# Patient Record
Sex: Male | Born: 1975 | Race: White | Hispanic: No | Marital: Married | Smoking: Never smoker
Health system: Southern US, Community
[De-identification: ages and names within clinical notes are randomized; demographics above are authoritative.]

## PROBLEM LIST (undated history)

## (undated) DIAGNOSIS — Z8669 Personal history of other diseases of the nervous system and sense organs: Secondary | ICD-10-CM

## (undated) DIAGNOSIS — K219 Gastro-esophageal reflux disease without esophagitis: Secondary | ICD-10-CM

## (undated) DIAGNOSIS — S46319A Strain of muscle, fascia and tendon of triceps, unspecified arm, initial encounter: Secondary | ICD-10-CM

## (undated) DIAGNOSIS — K635 Polyp of colon: Secondary | ICD-10-CM

## (undated) DIAGNOSIS — T7840XA Allergy, unspecified, initial encounter: Secondary | ICD-10-CM

## (undated) DIAGNOSIS — K449 Diaphragmatic hernia without obstruction or gangrene: Secondary | ICD-10-CM

## (undated) DIAGNOSIS — K589 Irritable bowel syndrome without diarrhea: Secondary | ICD-10-CM

## (undated) DIAGNOSIS — K859 Acute pancreatitis without necrosis or infection, unspecified: Secondary | ICD-10-CM

## (undated) HISTORY — DX: Allergy, unspecified, initial encounter: T78.40XA

## (undated) HISTORY — PX: LAPAROSCOPIC CHOLECYSTECTOMY: SUR755

## (undated) HISTORY — DX: Gastro-esophageal reflux disease without esophagitis: K21.9

## (undated) HISTORY — DX: Acute pancreatitis without necrosis or infection, unspecified: K85.90

## (undated) HISTORY — DX: Diaphragmatic hernia without obstruction or gangrene: K44.9

## (undated) HISTORY — DX: Irritable bowel syndrome, unspecified: K58.9

## (undated) HISTORY — PX: POLYPECTOMY: SHX149

## (undated) HISTORY — DX: Polyp of colon: K63.5

## (undated) HISTORY — PX: SPHINCTEROTOMY: SHX5279

## (undated) HISTORY — PX: TONSILLECTOMY: SUR1361

## (undated) HISTORY — DX: Personal history of other diseases of the nervous system and sense organs: Z86.69

## (undated) HISTORY — PX: OTHER SURGICAL HISTORY: SHX169

---

## 2003-12-05 ENCOUNTER — Other Ambulatory Visit: Payer: Self-pay

## 2003-12-06 ENCOUNTER — Encounter: Payer: Self-pay | Admitting: Gastroenterology

## 2003-12-08 ENCOUNTER — Encounter: Payer: Self-pay | Admitting: Gastroenterology

## 2004-01-02 ENCOUNTER — Encounter: Payer: Self-pay | Admitting: Gastroenterology

## 2004-01-05 ENCOUNTER — Encounter: Payer: Self-pay | Admitting: Gastroenterology

## 2004-01-26 ENCOUNTER — Other Ambulatory Visit: Payer: Self-pay

## 2004-04-13 ENCOUNTER — Encounter: Payer: Self-pay | Admitting: Gastroenterology

## 2004-04-16 ENCOUNTER — Ambulatory Visit: Payer: Self-pay | Admitting: Gastroenterology

## 2004-04-16 ENCOUNTER — Encounter: Admission: RE | Admit: 2004-04-16 | Discharge: 2004-04-16 | Payer: Self-pay | Admitting: Gastroenterology

## 2004-04-17 ENCOUNTER — Ambulatory Visit: Payer: Self-pay | Admitting: Gastroenterology

## 2004-09-05 ENCOUNTER — Ambulatory Visit: Payer: Self-pay | Admitting: Gastroenterology

## 2004-09-07 ENCOUNTER — Ambulatory Visit (HOSPITAL_COMMUNITY): Admission: RE | Admit: 2004-09-07 | Discharge: 2004-09-07 | Payer: Self-pay | Admitting: Gastroenterology

## 2004-10-03 ENCOUNTER — Ambulatory Visit: Payer: Self-pay | Admitting: Gastroenterology

## 2004-10-12 ENCOUNTER — Ambulatory Visit (HOSPITAL_COMMUNITY): Admission: RE | Admit: 2004-10-12 | Discharge: 2004-10-12 | Payer: Self-pay | Admitting: Gastroenterology

## 2004-10-12 ENCOUNTER — Ambulatory Visit: Payer: Self-pay | Admitting: Gastroenterology

## 2004-11-05 ENCOUNTER — Ambulatory Visit: Payer: Self-pay | Admitting: Gastroenterology

## 2004-11-29 ENCOUNTER — Ambulatory Visit: Payer: Self-pay | Admitting: Gastroenterology

## 2004-12-11 ENCOUNTER — Ambulatory Visit: Payer: Self-pay | Admitting: Gastroenterology

## 2004-12-12 ENCOUNTER — Inpatient Hospital Stay (HOSPITAL_COMMUNITY): Admission: RE | Admit: 2004-12-12 | Discharge: 2004-12-14 | Payer: Self-pay | Admitting: Gastroenterology

## 2004-12-25 ENCOUNTER — Ambulatory Visit: Payer: Self-pay | Admitting: Gastroenterology

## 2005-02-05 ENCOUNTER — Ambulatory Visit: Payer: Self-pay | Admitting: Gastroenterology

## 2005-07-03 ENCOUNTER — Ambulatory Visit: Payer: Self-pay | Admitting: Gastroenterology

## 2005-08-23 IMAGING — CR DG UGI W/ KUB
13 of 17 series · 18 of 24 positions shown · non-contrast
Comparison: none

CLINICAL DATA: Epigastric pain.  Rule out reflux.
 UPPER GI WITH KUB:
 The esophageal mucosa and motility are normal.  There is a tiny hiatal hernia without reflux.  The stomach appears normal.  The duodenal bulb appears normal.  There is no ulcer or mass.

[Series 1: run · 4 of 10 slices shown (1 of 12)]
[im 1/10]
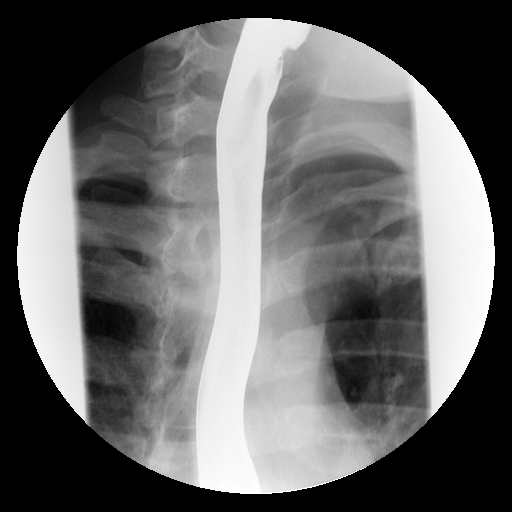
[im 5/10]
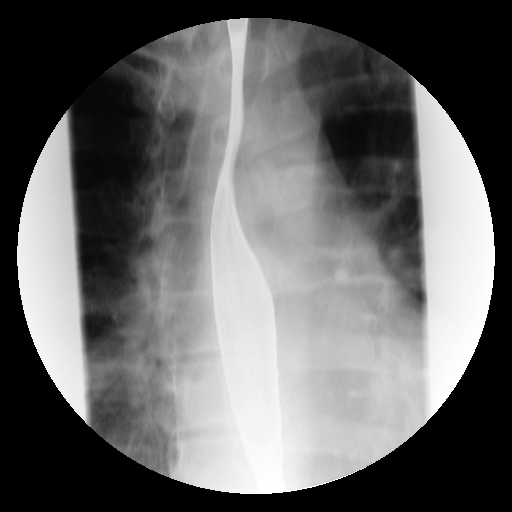
[im 7/10]
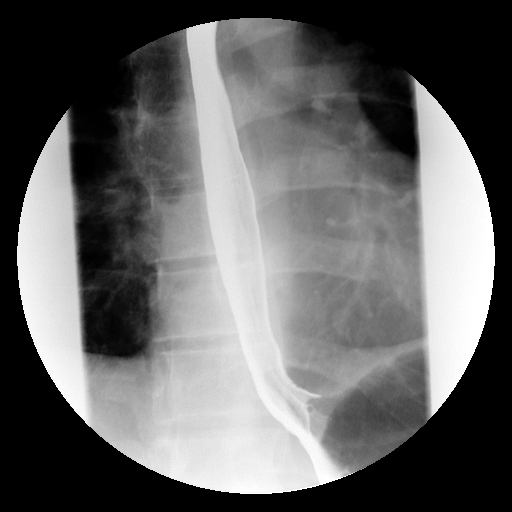
[im 10/10]
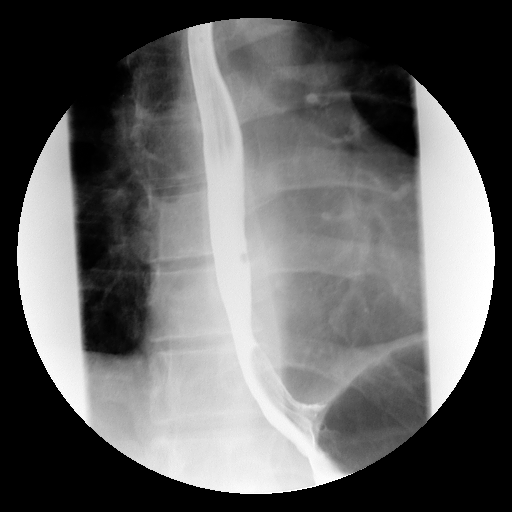

[run (2 of 12)]
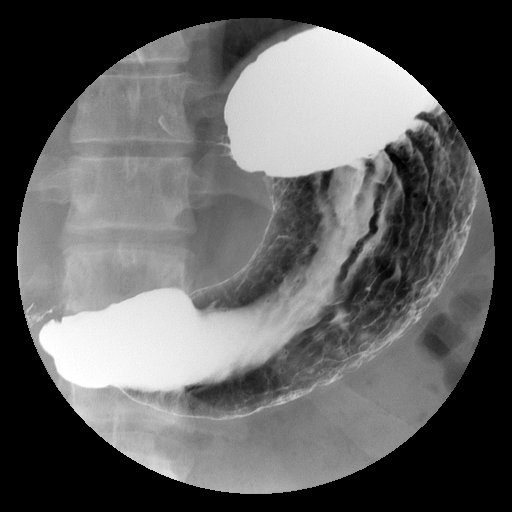

[run (3 of 12)]
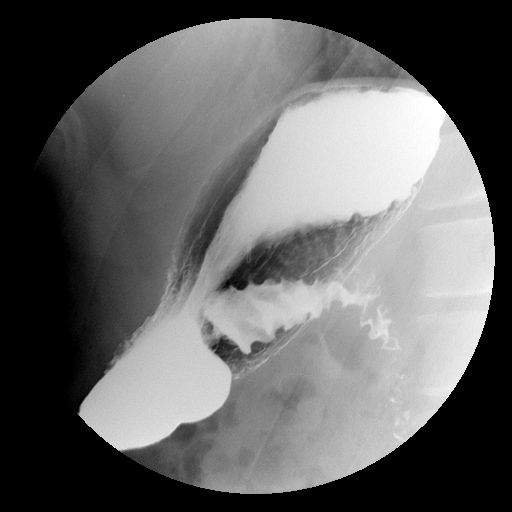

[run (4 of 12)]
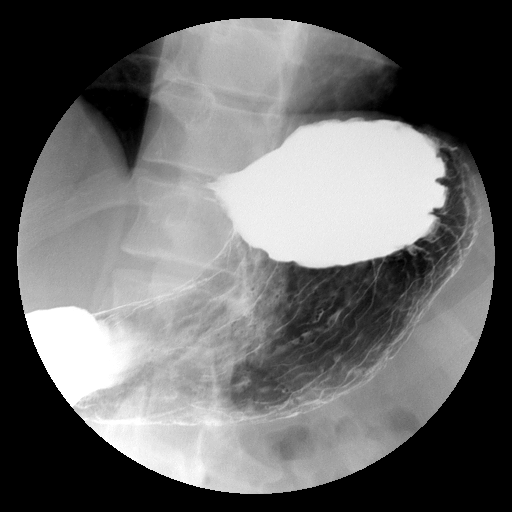

[run (5 of 12)]
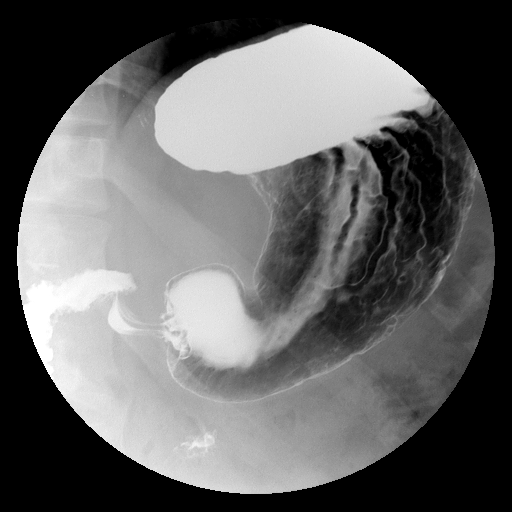

[Series 8: run · 3 of 9 slices shown (6 of 12)]
[im 1/9]
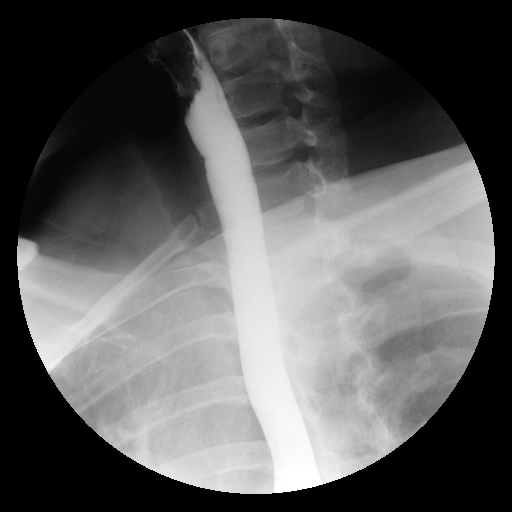
[im 3/9]
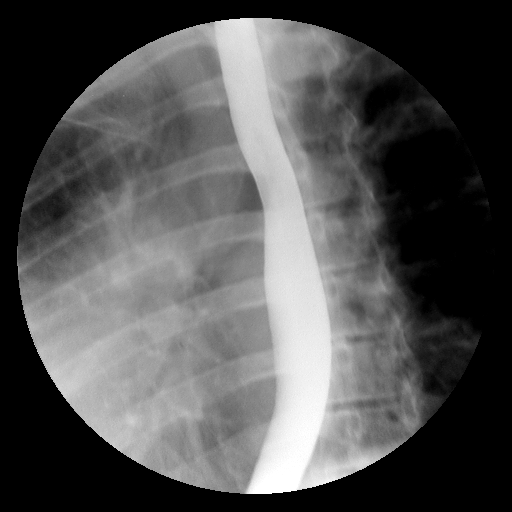
[im 9/9]
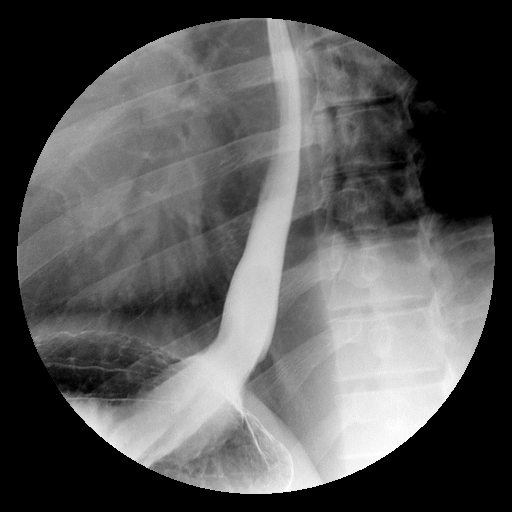

[run (7 of 12)]
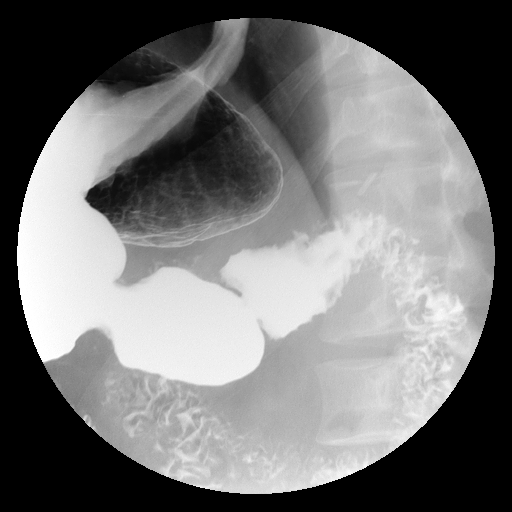

[run (8 of 12)]
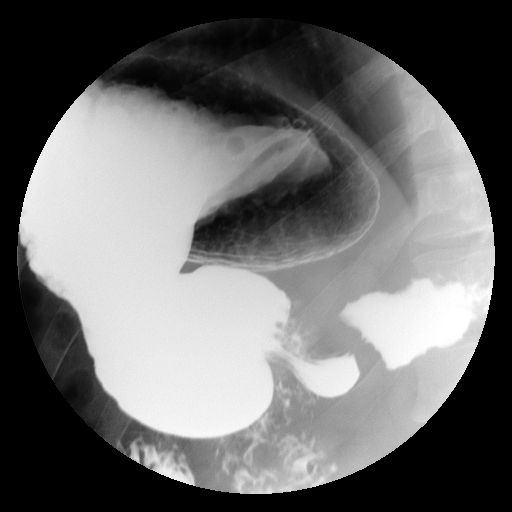

[run (9 of 12)]
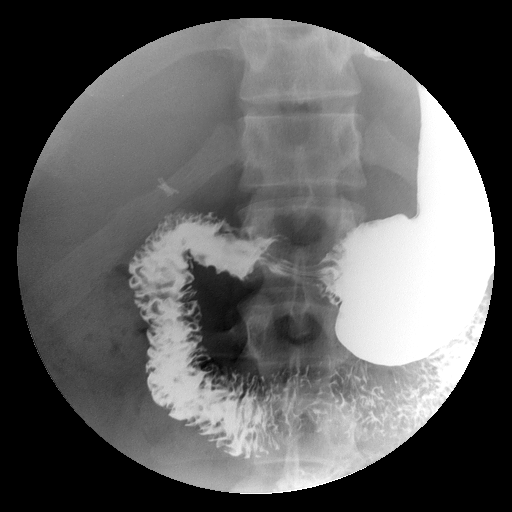

[run (10 of 12)]
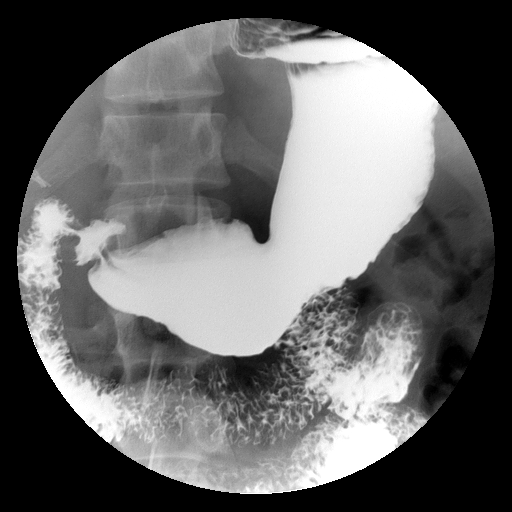

[run (11 of 12)]
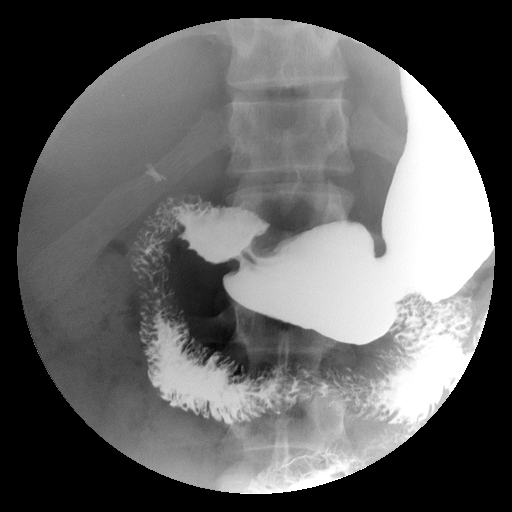

[run (12 of 12)]
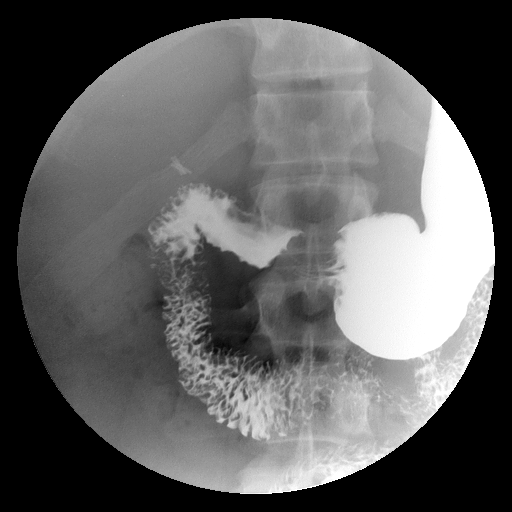

[view not recorded]
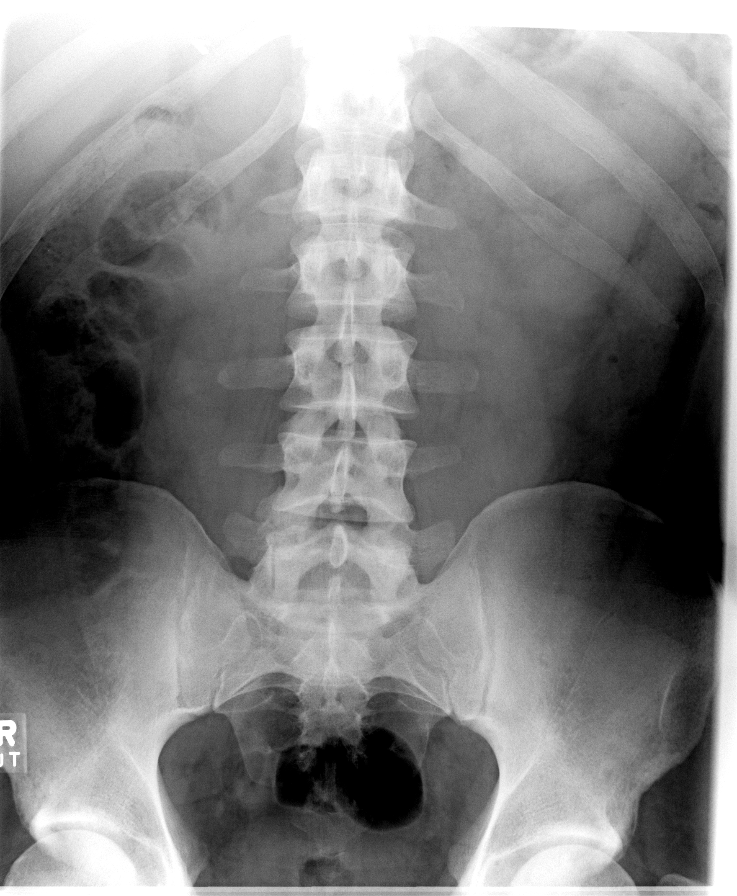

[18 of 24 positions shown; findings below may reference images not displayed]

IMPRESSION: Negative.

## 2006-08-25 ENCOUNTER — Ambulatory Visit: Payer: Self-pay | Admitting: Gastroenterology

## 2007-06-12 ENCOUNTER — Ambulatory Visit: Payer: Self-pay | Admitting: Family Medicine

## 2007-08-25 ENCOUNTER — Ambulatory Visit: Payer: Self-pay | Admitting: Gastroenterology

## 2007-08-26 ENCOUNTER — Ambulatory Visit: Payer: Self-pay | Admitting: Gastroenterology

## 2007-08-26 ENCOUNTER — Encounter: Payer: Self-pay | Admitting: Gastroenterology

## 2007-09-21 ENCOUNTER — Telehealth: Payer: Self-pay | Admitting: Gastroenterology

## 2007-09-22 ENCOUNTER — Telehealth: Payer: Self-pay | Admitting: Gastroenterology

## 2008-09-05 ENCOUNTER — Telehealth: Payer: Self-pay | Admitting: Gastroenterology

## 2008-09-28 ENCOUNTER — Telehealth: Payer: Self-pay | Admitting: Gastroenterology

## 2008-10-11 ENCOUNTER — Ambulatory Visit: Payer: Self-pay | Admitting: Gastroenterology

## 2008-10-11 DIAGNOSIS — K219 Gastro-esophageal reflux disease without esophagitis: Secondary | ICD-10-CM

## 2008-10-12 ENCOUNTER — Encounter: Payer: Self-pay | Admitting: Gastroenterology

## 2008-10-13 ENCOUNTER — Telehealth: Payer: Self-pay | Admitting: Gastroenterology

## 2008-10-28 ENCOUNTER — Ambulatory Visit: Payer: Self-pay | Admitting: Internal Medicine

## 2008-11-26 ENCOUNTER — Ambulatory Visit: Payer: Self-pay | Admitting: Internal Medicine

## 2008-12-08 ENCOUNTER — Ambulatory Visit (HOSPITAL_BASED_OUTPATIENT_CLINIC_OR_DEPARTMENT_OTHER): Admission: RE | Admit: 2008-12-08 | Discharge: 2008-12-08 | Payer: Self-pay | Admitting: Orthopedic Surgery

## 2009-06-21 ENCOUNTER — Ambulatory Visit: Payer: Self-pay | Admitting: Gastroenterology

## 2009-06-21 DIAGNOSIS — R110 Nausea: Secondary | ICD-10-CM

## 2009-06-21 DIAGNOSIS — R109 Unspecified abdominal pain: Secondary | ICD-10-CM | POA: Insufficient documentation

## 2009-07-07 ENCOUNTER — Telehealth: Payer: Self-pay | Admitting: Gastroenterology

## 2009-07-31 ENCOUNTER — Ambulatory Visit: Payer: Self-pay | Admitting: Gastroenterology

## 2009-07-31 DIAGNOSIS — R197 Diarrhea, unspecified: Secondary | ICD-10-CM | POA: Insufficient documentation

## 2009-07-31 DIAGNOSIS — Z86010 Personal history of colonic polyps: Secondary | ICD-10-CM

## 2009-08-01 ENCOUNTER — Encounter: Payer: Self-pay | Admitting: Gastroenterology

## 2009-08-08 ENCOUNTER — Telehealth: Payer: Self-pay | Admitting: Gastroenterology

## 2009-08-10 ENCOUNTER — Encounter (INDEPENDENT_AMBULATORY_CARE_PROVIDER_SITE_OTHER): Payer: Self-pay | Admitting: *Deleted

## 2009-08-11 ENCOUNTER — Ambulatory Visit: Payer: Self-pay | Admitting: Gastroenterology

## 2009-08-30 ENCOUNTER — Ambulatory Visit: Payer: Self-pay | Admitting: Gastroenterology

## 2009-11-21 ENCOUNTER — Telehealth: Payer: Self-pay | Admitting: Gastroenterology

## 2010-06-10 ENCOUNTER — Encounter: Payer: Self-pay | Admitting: Gastroenterology

## 2010-06-20 NOTE — Op Note (Signed)
Summary: [facility]   Imported By: Lester [place] 08/01/2009 07:10:40  _____________________________________________________________________  External Attachment:    Type:   Image     Comment:   External Document

## 2010-06-20 NOTE — Progress Notes (Signed)
Summary: Triage   Phone Note Call from Patient Call back at [phone]   Caller: Patient Call For: Dr. Russella Dar Reason for Call: Talk to Nurse Summary of Call: Pt. has an appt. on 08-30-09 and wants a sooner appt. Said he feels like he has a "bacterial infection". Initial call taken by: Karna Christmas,  August 08, 2009 9:34 AM  Follow-up for Phone Call        Patient states that his symptoms of "stomach ache" haven't improved after switching PPIs to Achiphex.  Per office note on 08-01-09 may need EGD for further evaluation.  Please advise if ok for direct EGD.   Follow-up by: Darcey Nora RN, CGRN,  August 08, 2009 10:23 AM  Additional Follow-up for Phone Call Additional follow up Details #1::        Schedule direct EGD. Additional Follow-up by: Meryl Dare MD Clementeen Graham,  August 08, 2009 10:50 AM    Additional Follow-up for Phone Call Additional follow up Details #2::    Left message for patient to call back Darcey Nora RN, [facility]  August 08, 2009 11:02 AM  Additional Follow-up for Phone Call Additional follow up Details #3:: Details for Additional Follow-up Action Taken: Pt called and an EGD was sch'ed for 9 days from now (08/18/2009)with a previsit 3 days from now (08/11/2009). Additional Follow-up by: Vallarie Mare,  August 09, 2009 10:09 AM

## 2010-06-20 NOTE — Letter (Signed)
Summary: EGD Instructions  [facility]  [address] [place], [place] [address]   Phone: [phone]  Fax: [phone]       Victor Craig    1975-11-15    MRN: [id number]       Procedure Day /Date:  08/18/09  Friday     Arrival Time:   3pm     Procedure Time: 4pm     Location of Procedure:                    _ x _ [facility] (4th Floor)   PREPARATION FOR ENDOSCOPY   On  08/18/09    Friday    THE DAY OF THE PROCEDURE:  1.   No solid foods, milk or milk products are allowed after midnight the night before your procedure.  2.   Do not drink anything colored red or purple.  Avoid juices with pulp.  No orange juice.  3.  You may drink clear liquids until  2:00pm, which is 2 hours before your procedure.                                                                                                CLEAR LIQUIDS INCLUDE: Water Jello Ice Popsicles Tea (sugar ok, no milk/cream) Powdered fruit flavored drinks Coffee (sugar ok, no milk/cream) Gatorade Juice: apple, white grape, white cranberry  Lemonade Clear bullion, consomm, broth Carbonated beverages (any kind) Strained chicken noodle soup Hard Candy   MEDICATION INSTRUCTIONS  Unless otherwise instructed, you should take regular prescription medications with a small sip of water as early as possible the morning of your procedure.               OTHER INSTRUCTIONS  You will need a responsible adult at least 37 years of age to accompany you and drive you home.   This person must remain in the waiting room during your procedure.  Wear loose fitting clothing that is easily removed.  Leave jewelry and other valuables at home.  However, you may wish to bring a book to read or an iPod/MP3 player to listen to music as you wait for your procedure to start.  Remove all body piercing jewelry and leave at home.  Total time from sign-in until discharge is approximately 2-3 hours.  You should  go home directly after your procedure and rest.  You can resume normal activities the day after your procedure.  The day of your procedure you should not:   Drive   Make legal decisions   Operate machinery   Drink alcohol   Return to work  You will receive specific instructions about eating, activities and medications before you leave.    The above instructions have been reviewed and explained to me by   Ezra Sites RN  August 11, 2009 2:27 PM    I fully understand and can verbalize these instructions _____________________________ Date _________

## 2010-06-20 NOTE — Miscellaneous (Signed)
Summary: [facility]  Clinical Lists Changes  Allergies: Changed allergy or adverse reaction from SULFA to SULFA 

## 2010-06-20 NOTE — Assessment & Plan Note (Signed)
Summary: F/U APPT...LSW.   History of Present Illness Visit Type: Follow-up Visit Primary GI MD: [name] [facility] Primary Provider: [name], MD Chief Complaint: c/o light colored stool, but overall diarrhea is better.  Pt states that epigastric pain is also better History of Present Illness:   Mr. Victor Craig returns today with the resolution of his epigastric pain. He notes his stools are light brown to medium brown. Stools are well-formed.   GI Review of Systems    Reports abdominal pain.     Location of  Abdominal pain: epigastric area.    Denies acid reflux, belching, bloating, chest pain, dysphagia with liquids, dysphagia with solids, heartburn, loss of appetite, nausea, vomiting, vomiting blood, weight loss, and  weight gain.        Denies anal fissure, black tarry stools, change in bowel habit, constipation, diarrhea, diverticulosis, fecal incontinence, heme positive stool, hemorrhoids, irritable bowel syndrome, jaundice, light color stool, liver problems, rectal bleeding, and  rectal pain.   Current Medications (verified): 1)  Multivitamins  Tabs (Multiple Vitamin) .... Take 1 Tablet By Mouth Once A Day 2)  Robinul-Forte 2 Mg Tabs (Glycopyrrolate) .... One Tablet By Mouth Two Times A Day As Needed 3)  Vitamin B-12 100 Mcg Tabs (Cyanocobalamin) .... Take 2 Tablets By Mouth Two Times A Day 4)  Odorless Garlic 500 Mg Tabs (Garlic) .... Take 2 Tablets By Mouth Two Times A Day 5)  Aciphex 20 Mg Tbec (Rabeprazole Sodium) .... One Tablet By Mouth Two Times A Day  Allergies (verified): 1)  ! Sulfa  Past History:  Past Medical History: Reviewed history from 10/11/2008 and no changes required. GERD Pancreatitis-post ERCP Irritable Bowel Syndrome Duodenitis Hiatal hernia  Past Surgical History: Reviewed history from 07/31/2009 and no changes required. Laparoscopic Cholecystectomy 12/2003 Sphincterotomy Tonsillectomy  Family History: Reviewed history from 10/11/2008  and no changes required. Family History of Colon Polyps:Father Family History of Prostate Cancer: Paternal Grandfather Family History of Heart Disease: Maternal Grandfather No FH of Colon Cancer:  Social History: Reviewed history from 10/11/2008 and no changes required. Married, 1 boy, 1 girl Patient has never smoked.  Illicit Drug Use - no Alcohol Use - yes-2 beers/week Patient gets regular exercise.  Review of Systems       The pertinent positives and negatives are noted as above and in the HPI. All other ROS were reviewed and were negative.   Vital Signs:  Patient profile:   35 year old male Height:      71 inches Weight:      247 pounds BMI:     34.57 Pulse rate:   64 / minute Pulse rhythm:   regular BP sitting:   110 / 72  (left arm) Cuff size:   large  Vitals Entered By: Francee Piccolo CMA [name]) (August 30, 2009 1:44 PM)  Physical Exam  General:  Well developed, well nourished, no acute distress. Head:  Normocephalic and atraumatic. Eyes:  PERRLA, no icterus. Mouth:  No deformity or lesions, dentition normal. Lungs:  Clear throughout to auscultation. Heart:  Regular rate and rhythm; no murmurs, rubs,  or bruits. Abdomen:  Soft, nontender and nondistended. No masses, hepatosplenomegaly or hernias noted. Normal bowel sounds. Psych:  Alert and cooperative. Normal mood and affect.  Impression & Recommendations:  Problem # 1:  ABDOMINAL PAIN-EPIGASTRIC (ICD-789.06) Presumed GERD. Symptoms improved on AcipHex 20 mg twice daily. He may reintroduce milk products and his diet.  Patient Instructions: 1)  Please continue current medications.  2)  Please schedule a follow-up appointment as needed.  3)  The medication list was reviewed and reconciled.  All changed / newly prescribed medications were explained.  A complete medication list was provided to the patient / caregiver.

## 2010-06-20 NOTE — Medication Information (Signed)
Summary: Aciphex Approved/[employer]  Aciphex Approved/[employer]   Imported By: Sherian Rein 08/22/2009 14:37:14  _____________________________________________________________________  External Attachment:    Type:   Image     Comment:   External Document

## 2010-06-20 NOTE — Progress Notes (Signed)
Summary: ? re ov   Phone Note Call from Patient Call back at [phone]   Reason for Call: Talk to Nurse Summary of Call: Patient wants to know if he needs an office visit so that Dr Russella Dar can changed his meds, states that he's already scheduled for one but was wondering if meds can be changed without him seeing Dr Russella Dar first. Initial call taken by: Tawni Levy,  November 21, 2009 2:05 PM  Follow-up for Phone Call        Left message for patient to call back Darcey Nora RN, [facility]  November 21, 2009 3:13 PM  patient called back and has decided he wants to stay on Aciphex, he will keep his appointment with Dr Russella Dar for August.  I did review and antireflux diet and measures with him Follow-up by: Darcey Nora RN, CGRN,  November 22, 2009 9:36 AM

## 2010-06-20 NOTE — Op Note (Signed)
Summary: Pre-orders/[facility]  Pre-orders/[facility]   Imported By: Lester Bern 08/01/2009 07:12:29  _____________________________________________________________________  External Attachment:    Type:   Image     Comment:   External Document

## 2010-06-20 NOTE — Assessment & Plan Note (Signed)
Summary: 2 wk f/u abd pain and change in bowels/all   History of Present Illness Visit Type: Follow-up Visit Primary GI MD: [name] [facility] Primary Provider: [name], MD Chief Complaint: F/U patient still having abdominal pain and an episoe of diarrhea this past weekend History of Present Illness:   Victor Craig returns today with persistent epigastric pain. He describes intermittent burning sensation in his epigastrium. It is not necessarily related to meals. He feels, Nexium is not working as well as it used to.  His diarrhea has completely resolved.   GI Review of Systems    Reports abdominal pain and  acid reflux.     Location of  Abdominal pain: epigastric area.    Denies belching, bloating, chest pain, dysphagia with liquids, dysphagia with solids, heartburn, loss of appetite, nausea, vomiting, vomiting blood, weight loss, and  weight gain.      Reports diarrhea.     Denies anal fissure, black tarry stools, change in bowel habit, constipation, diverticulosis, fecal incontinence, heme positive stool, hemorrhoids, irritable bowel syndrome, jaundice, light color stool, liver problems, rectal bleeding, and  rectal pain.   Current Medications (verified): 1)  Nexium 40 Mg Cpdr (Esomeprazole Magnesium) .... One Tablet By Mouth Two Times A Day 2)  Multivitamins  Tabs (Multiple Vitamin) .... Take 1 Tablet By Mouth Once A Day 3)  Robinul-Forte 2 Mg Tabs (Glycopyrrolate) .... One Tablet By Mouth Two Times A Day 4)  Vitamin B-12 100 Mcg Tabs (Cyanocobalamin) .... Take 2 Tablets By Mouth Two Times A Day 5)  Odorless Garlic 500 Mg Tabs (Garlic) .... Take 2 Tablets By Mouth Two Times A Day  Allergies (verified): 1)  ! Sulfa  Past History:  Past Medical History: Reviewed history from 10/11/2008 and no changes required. GERD Pancreatitis-post ERCP Irritable Bowel Syndrome Duodenitis Hiatal hernia  Past Surgical History: Laparoscopic Cholecystectomy  12/2003 Sphincterotomy Tonsillectomy  Family History: Reviewed history from 10/11/2008 and no changes required. Family History of Colon Polyps:Father Family History of Prostate Cancer: Paternal Grandfather Family History of Heart Disease: Maternal Grandfather No FH of Colon Cancer:  Social History: Reviewed history from 10/11/2008 and no changes required. Married Patient has never smoked.  Illicit Drug Use - no Alcohol Use - yes-2 beers/week Patient gets regular exercise.  Review of Systems       The pertinent positives and negatives are noted as above and in the HPI. All other ROS were reviewed and were negative.   Vital Signs:  Patient profile:   35 year old male Height:      71 inches Weight:      250.13 pounds BMI:     35.01 Pulse rate:   72 / minute Pulse rhythm:   regular BP sitting:   110 / 72  (left arm) Cuff size:   regular  Vitals Entered By: [name] CMA [name]) (July 31, 2009 1:41 PM)  Physical Exam  General:  Well developed, well nourished, no acute distress. Head:  Normocephalic and atraumatic. Eyes:  PERRLA, no icterus. Mouth:  No deformity or lesions, dentition normal. Lungs:  Clear throughout to auscultation. Heart:  Regular rate and rhythm; no murmurs, rubs,  or bruits. Abdomen:  Soft, nontender and nondistended. No masses, hepatosplenomegaly or hernias noted. Normal bowel sounds. Psych:  Alert and cooperative. Normal mood and affect.  Impression & Recommendations:  Problem # 1:  ABDOMINAL PAIN-EPIGASTRIC (ICD-789.06) Possibly reflux related. Discontinue Nexium and begin AcipHex 20 mg daily. Intensify all antireflux measures. Discontinue Robinul.  If his symptoms do not improve plan to proceed with upper endoscopy for further evaluation.  Problem # 2:  GERD (ICD-530.81) As above.  Problem # 3:  DIARRHEA (ICD-787.91) Presumed postinfectious diarrhea, has resolved. Discontinue Robinul. Reintroduce lactose products as tolerated  Patient  Instructions: 1)  Stop Nexium and start Aciphex one tablet by mouth two times a day. 2)  Change Robinul to a as needed basis. 3)  Avoid foods high in acid content ( tomatoes, citrus juices, spicy foods) . Avoid eating within 3 to 4 hours of lying down or before exercising. Do not over eat; try smaller more frequent meals. Elevate head of bed four inches when sleeping.  4)  Please schedule a follow-up appointment in 3-4 weeks.  5)  Copy sent to : [name], MD 6)  The medication list was reviewed and reconciled.  All changed / newly prescribed medications were explained.  A complete medication list was provided to the patient / caregiver.  Prescriptions: ACIPHEX 20 MG TBEC (RABEPRAZOLE SODIUM) one tablet by mouth two times a day  #60 x 11   Entered by:   Christie Nottingham CMA (AAMA)   Authorized by:   Meryl Dare MD [facility]   Signed by:   Christie Nottingham CMA (AAMA) on 07/31/2009   Method used:   [facility]        [id number]       [address]       [place], [place]  [address]       Ph: [phone] or [phone]       Fax: [phone]   RxID:   [id number]

## 2010-06-20 NOTE — Progress Notes (Signed)
Summary: Can he have Lactose Free milk   Phone Note Call from Patient Call back at [phone]   Call For: Dr Russella Dar Summary of Call: Was told he should not dring milk. Wonders if he can have Lactose free milk. Initial call taken by: Leanor Kail [facility],  July 07, 2009 11:54 AM  Follow-up for Phone Call        I have left the patient a message that it is ok to try lactose free milk, as long as it doesn't cause him abdominal pain Follow-up by: Darcey Nora RN, CGRN,  July 07, 2009 12:08 PM

## 2010-06-20 NOTE — Assessment & Plan Note (Signed)
Summary: "stomach virus" feeling in stomach/constipation...as.   History of Present Illness Visit Type: Follow-up Visit Primary GI MD: [name] [facility] Primary Provider: [name], MD Chief Complaint: Pt. had stomach virus over Christmas and continues bothering him with alternating diarrhea and constipation History of Present Illness:   Victor Craig relates a diarrheal illness around Christmas, with severe, watery diarrhea, nausea, and upper abdominal discomfort that lasted for about one week. His diarrhea resolved, but he has persistent problems with nausea and upper abdominal discomfort. His stools are still slightly loose but are only occurring once or twice a day. He denies any recent antibiotic usage or new medications.   GI Review of Systems    Reports abdominal pain and  nausea.     Location of  Abdominal pain: upper abdomen.    Denies acid reflux, belching, bloating, chest pain, dysphagia with liquids, dysphagia with solids, heartburn, loss of appetite, vomiting, vomiting blood, weight loss, and  weight gain.      Reports change in bowel habits.     Denies anal fissure, black tarry stools, constipation, diarrhea, diverticulosis, fecal incontinence, heme positive stool, hemorrhoids, irritable bowel syndrome, jaundice, light color stool, liver problems, rectal bleeding, and  rectal pain.   Current Medications (verified): 1)  Nexium 40 Mg Cpdr (Esomeprazole Magnesium) .... One Tablet By Mouth Two Times A Day 2)  Multivitamins  Tabs (Multiple Vitamin) .... Take 1 Tablet By Mouth Once A Day  Allergies (verified): 1)  ! Sulfa  Past History:  Past Medical History: Reviewed history from 10/11/2008 and no changes required. GERD Pancreatitis-post ERCP Irritable Bowel Syndrome Duodenitis Hiatal hernia  Past Surgical History: Reviewed history from 10/11/2008 and no changes required. Laparoscopic Cholecystectomy 12/2003 Sphincterotomy  Family History: Reviewed history  from 10/11/2008 and no changes required. Family History of Colon Polyps:Father Family History of Prostate Cancer: Paternal Grandfather Family History of Heart Disease: Maternal Grandfather No FH of Colon Cancer:  Social History: Reviewed history from 10/11/2008 and no changes required. Married Patient has never smoked.  Illicit Drug Use - no Alcohol Use - yes-2 beers/week Patient gets regular exercise.  Review of Systems       The patient complains of allergy/sinus and cough.         The pertinent positives and negatives are noted as above and in the HPI. All other ROS were reviewed and were negative.   Vital Signs:  Patient profile:   35 year old male Height:      71 inches Weight:      250 pounds BMI:     34.99 Pulse rate:   68 / minute Pulse rhythm:   regular BP sitting:   128 / 86  (left arm)  Vitals Entered By: Victor Craig NCMA (June 21, 2009 9:12 AM)  Physical Exam  General:  Well developed, well nourished, no acute distress. Head:  Normocephalic and atraumatic. Eyes:  PERRLA, no icterus. Ears:  Normal auditory acuity. Mouth:  No deformity or lesions, dentition normal. Neck:  Supple; no masses or thyromegaly. Lungs:  Clear throughout to auscultation. Heart:  Regular rate and rhythm; no murmurs, rubs,  or bruits. Abdomen:  Soft, nontender and nondistended. No masses, hepatosplenomegaly or hernias noted. Normal bowel sounds. Msk:  Symmetrical with no gross deformities. Normal posture. Pulses:  Normal pulses noted. Extremities:  No clubbing, cyanosis, edema or deformities noted. Neurologic:  Alert and  oriented x4;  grossly normal neurologically. Skin:  Intact without significant lesions or rashes. Cervical Nodes:  No significant cervical adenopathy. Axillary Nodes:  No significant axillary adenopathy. Psych:  Alert and cooperative. Normal mood and affect.  Impression & Recommendations:  Problem # 1:  ABDOMINAL PAIN-MULTIPLE SITES (ICD-789.09) Probable  post infectious symptoms.Ttrial of a probiotic, Robinul twice daily, Gas-X qid prn, and a low gas, lactose free diet. If his symptoms do not respond proceed with further evaluation.   Problem # 2:  NAUSEA (ICD-787.02) As in problem #1.  Problem # 3:  GERD (ICD-530.81) Continue Nexium twice daily, and standard antireflux measures.  Patient Instructions: 1)  Pick up your prescription at your pharmacy.  2)  Excessive Gas Diet handout given.  3)  Start Gas-X four times a day as needed. 4)  Start Align one tablet by mouth once daily x 1 month. 5)  Your f/u appt has been schedule for 2 weeks on 07/06/09 at 8:30am. 6)  Copy sent to : [name], MD 7)  The medication list was reviewed and reconciled.  All changed / newly prescribed medications were explained.  A complete medication list was provided to the patient / caregiver.  Prescriptions: ROBINUL-FORTE 2 MG TABS (GLYCOPYRROLATE) one tablet by mouth two times a day  #60 x 11   Entered by:   Christie Nottingham CMA (AAMA)   Authorized by:   Meryl Dare MD [facility]   Signed by:   Meryl Dare MD [facility] on 06/21/2009   Method used:   [id number]       [address]       [place], [place]  [address]       Ph: [phone] or [phone]       Fax: [phone]   RxID:   [id number]

## 2010-07-29 ENCOUNTER — Ambulatory Visit: Payer: Self-pay | Admitting: Family Medicine

## 2010-08-20 ENCOUNTER — Ambulatory Visit (INDEPENDENT_AMBULATORY_CARE_PROVIDER_SITE_OTHER): Payer: BC Managed Care – PPO | Admitting: Gastroenterology

## 2010-08-20 ENCOUNTER — Encounter: Payer: Self-pay | Admitting: Gastroenterology

## 2010-08-20 VITALS — BP 104/73 | HR 84 | Ht 71.0 in | Wt 247.6 lb

## 2010-08-20 DIAGNOSIS — K219 Gastro-esophageal reflux disease without esophagitis: Secondary | ICD-10-CM

## 2010-08-20 MED ORDER — RABEPRAZOLE SODIUM 20 MG PO TBEC: 20.0000 mg | DELAYED_RELEASE_TABLET | Freq: Two times a day (BID) | ORAL | Status: DC

## 2010-08-20 NOTE — Assessment & Plan Note (Signed)
Chronic GERD. Symptoms very well controlled on AcipHex 20 mg twice daily and standard antireflux measures. Continue AcipHex 20 mg twice daily. Return office visit one year.

## 2010-08-20 NOTE — Progress Notes (Signed)
History of Present Illness: This is a 35 year old male here today for followup of GERD. His reflux symptoms are under excellent control on AcipHex 20 milligrams twice daily. He has rare episodes of breakthrough symptoms: unclear as to the cause when he does have breakthrough. He denies dysphagia, chest pain, abdominal pain, change in bowel habits, nausea, vomiting.   Current Medications, Allergies, Past Medical History, Past Surgical History, Family History and Social History were reviewed in [employer] record.  Physical Exam: General: Well developed , well nourished, no acute distress Head: Normocephalic and atraumatic Eyes:  sclerae anicteric, EOMI Ears: Normal auditory acuity Mouth: No deformity or lesions Lungs: Clear throughout to auscultation Heart: Regular rate and rhythm; no murmurs, rubs or bruits Abdomen: Soft, non tender and non distended. No masses, hepatosplenomegaly or hernias noted. Normal Bowel sounds Musculoskeletal: Symmetrical with no gross deformities  Pulses:  Normal pulses noted Extremities: No clubbing, cyanosis, edema or deformities noted Psychological:  Alert and cooperative. Normal mood and affect  Assessment and Recommendations:

## 2010-08-20 NOTE — Patient Instructions (Signed)
Your prescription for Aciphex has been sent to your pharmacy.

## 2010-08-26 LAB — POCT HEMOGLOBIN-HEMACUE: Hemoglobin: 15.6 g/dL (ref 13.0–17.0)

## 2010-10-02 NOTE — Op Note (Signed)
NAME:  Victor Craig, Victor Craig NO.:  [id number]   MEDICAL RECORD NO.:  [id number]          PATIENT TYPE:  AMB   LOCATION:  DSC                          FACILITY:  [facility]   PHYSICIAN:  Loreta Ave, M.D. DATE OF BIRTH:  Jul 23, 1975   DATE OF PROCEDURE:  12/08/2008  DATE OF DISCHARGE:                               OPERATIVE REPORT   PREOPERATIVE DIAGNOSIS:  Avulsion of triceps tendon off olecranon, left  elbow.   POSTOPERATIVE DIAGNOSIS:  Avulsion of triceps tendon off olecranon, left  elbow.   PROCEDURE:  Repair of triceps tendon back to olecranon, left elbow.  A  bioabsorbable corkscrew 5.5-mm anchor and FiberWire suture x2.   SURGEON:  Loreta Ave, MD   ASSISTANT:  Genene Churn. Barry Dienes, PA   ANESTHESIA:  General.   BLOOD LOSS:  Minimal.   TOURNIQUET TIME:  45 minutes.   SPECIMENS:  None.   CULTURES:  None.   COMPLICATIONS:  None.   DRESSING:  Sterile compressive with a splint placed in the elbow at  about 30 degrees of flexion.   PROCEDURE:  The patient was brought to the operating room, placed on the  operating table in supine position.  After adequate anesthesia have been  obtained, tourniquet applied to upper aspect of the left arm.  Appropriate support to access the back of the elbow.  Prepped and draped  in usual sterile fashion.  Exsanguinated with elevation of Esmarch,  tourniquet was inflated to 250 mmHg.  A longitudinal incision from the  olecranon proximally.  Skin and subcutaneous tissue divided.  Obvious  completely avulsion of the triceps about 3 cm of retraction.  Mobilized,  debrided back to healthy tissue.  This had avulsed right off the  olecranon.  A little bit of spurring, the air was debrided.  Ulnar nerve  identified and protected throughout.  The olecranon roughened and then a  5.5-mm anchor with FiberWire attach was seated directly in the middle of  the attachment site on the olecranon.  Countersunk.  I then weaved the  FiberWire  suture well proximally and distally both on the medial and  lateral aspects of the triceps.  Elbow was extended, suture tensioned  and then firmly tied in place with a nice apposition and repair.  I then  used the limb from each of the sutures to oversew this to make it more  contour at the tibia, olecranon.  All knots were buried.  Pleased with continuity and repair.  I could bring up to 90 degrees of  flexion without too much tension on the repair.  Wound was irrigated.  Closed with subcutaneous and subcuticular Vicryl.  Long-arm splint  applied.  Anesthesia reversed after tourniquet deflated.  Brought to  recovery room.  Tolerated the surgery well.  No complications.      Loreta Ave, M.D.  Electronically Signed     Loreta Ave, M.D.  Electronically Signed    DFM/MEDQ  D:  12/08/2008  T:  12/09/2008  Job:  [id number]

## 2010-10-02 NOTE — Assessment & Plan Note (Signed)
[facility]                         GASTROENTEROLOGY OFFICE NOTE   Victor Craig, Victor Craig ANDROS CHANNING                     MRN:          [id number]  DATE:08/25/2007                            DOB:          May 10, 1976    Victor Craig presents today with a 5-day history of bright red blood per  rectum associated with bowel movements. He states initially it was a  small amount of blood on the tissue paper, and now he notes streaking of  blood on the stool. It has occurred every day for the past 5 days. He  relates no abdominal pain, rectal pain, change in bowel habits, change  in stool caliber, diarrhea, constipation, or weight loss. His reflux  symptoms are well controlled on Nexium 40 mg b.i.d. He notes no chest  pain, dysphagia, or odynophagia. He does note frequent breakthrough  symptoms when he decreases to 1 Nexium a day, and his symptoms remain  under good control on Nexium b.i.d.   CURRENT MEDICATIONS:  1. Nexium 40 mg p.o. b.i.d.  2. Fish oil q day.  3. Multivitamin q day.   MEDICATION ALLERGIES:  SULFA DRUGS.   PHYSICAL EXAMINATION:  GENERAL:  Well-developed overweight white male in  no acute distress.  VITAL SIGNS:  Weight 258.4 pounds. Blood pressure is 140/90, pulse 72  and regular.  CHEST:  Clear to auscultation bilaterally.  CARDIAC:  Regular rate and rhythm without murmurs.  ABDOMEN:  Soft, nontender, nondistended. Normoactive bowel sounds. No  palpable organomegaly, masses, or hernias.  RECTAL:  No external or internal lesions. There is firm, brown,  Hemoccult negative stool in the vault.   ASSESSMENT AND PLAN:  1. Gastroesophageal reflux disease. Maintain standard anti-reflux      measures. Renew Nexium 40 mg p.o. b.i.d. taken 30 minutes before      breakfast and dinner for 1 year. Return office visit 1 year.  2. New onset, small volume hematochezia. Rule out hemorrhoids,      proctitis, and colorectal neoplasms. Risks, benefits, and  alternatives of colonoscopy with possible biopsy, possible      polypectomy, and possible destruction of internal hemorrhoids      discussed with the patient, and he consents to proceed and this      will be scheduled electively.     Venita Lick. Russella Dar, MD, [facility]  Electronically Signed    MTS/MedQ  DD: 08/25/2007  DT: 08/25/2007  Job #: [id number]

## 2010-10-05 NOTE — H&P (Signed)
NAME:  Victor Craig, Victor Craig NO.:  [id number]   MEDICAL RECORD NO.:  [id number]          PATIENT TYPE:  OBV   LOCATION:  1320                         FACILITY:  [facility]   PHYSICIAN:  Malcolm T. Russella Dar, M.D. [facility] OF BIRTH:  Jun 13, 1975   DATE OF ADMISSION:  12/11/2004  DATE OF DISCHARGE:                                HISTORY & PHYSICAL   CHIEF COMPLAINT:  Epigastric pain post ERCP.   HISTORY:  Victor Craig is a 35 year old white male with history of  recurrent episodes of epigastric pain over the past several months. He had  undergone laparoscopic cholecystectomy in August 2005 per Dr. Evette Cristal for  similar pain.  He was not found to have a gallstones at that time, but had a  low gallbladder ejection fraction on CCK HIDA scan. He has had several  episodes since that time.  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  He  had ERCP today which was negative and sphincterotomy was done. He tolerated  the procedure well, but is complaining of epigastric pain postprocedure  which has persisted. He rates this an 8-9/10. He has not had any associated  nausea or vomiting.  His vital signs have been stable. He is admitted at  this time for observation, pain control, to rule out post ERCP pancreatitis  versus spasm post sphincterotomy.   CURRENT MEDICATIONS:  1.  Multivitamins daily.  2.  Protonix or Nexium 40 milligrams daily.  3.  Levsin p.r.n.   ALLERGIES:  SULFA.   PAST HISTORY:  1.  Status post cholecystectomy in August 2005.  2.  Irritable bowel syndrome.   FAMILY HISTORY:  Noncontributory.   SOCIAL HISTORY:  The patient is married. He has one child.  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 No tobacco, no EtOH.   REVIEW OF SYSTEMS:  CARDIOVASCULAR:  Denies any chest pain or anginal  symptoms. PULMONARY:  Negative cough, shortness of breath, sputum  production. GENITOURINARY:  Negative for dysuria, urgency, or frequency.   LABORATORY DATA:  Laboratory studies were pending at this time.   PHYSICAL EXAMINATION:  GENERAL:  Well-developed white male sitting up in  bed.  VITAL SIGNS:  Temperature is 97.5, blood pressure 124/80, pulse in the 50s.  HEENT:  Nontraumatic, normocephalic. Extraocular movements intact.  Pupils  equal, round and reactive to light and accommodation. Sclerae anicteric.  Conjunctivae were pink.  CARDIOVASCULAR:  Regular rate and rhythm with S1-S2.  No murmur, rub, or  gallop.  PULMONARY:  Clear to auscultation and percussion.  ABDOMEN:  Bowel sounds are active.  He is tender in the epigastrium.  There  is no guarding or rebound. No distension.   IMPRESSION:  16.  A 35 year old male with epigastric pain post ERCP and sphincterotomy.      Rule out spasm, rule out post ERCP pancreatitis.  2.  Status post laparoscopic cholecystectomy in August 2005.   PLAN:  The patient is  admitted to the service of Dr. Claudette Head for  overnight observation for pain control, bowel rest, and will check hepatic  panel, amylase and lipase.  For details, please see the orders.       AE/MEDQ  D:  12/11/2004  T:  12/11/2004  Job:  [id number]

## 2010-10-05 NOTE — Assessment & Plan Note (Signed)
[facility]                         GASTROENTEROLOGY OFFICE NOTE   NAME:Bechtel, SAVAUGHN KARWOWSKI                     MRN:          [id number]  DATE:08/25/2006                            DOB:          07/07/1975    This is a return office visit for GERD. Mr. Fulmore notes his reflux  symptoms remain under very good control. He generally takes Nexium 40 mg  b.i.d. but occasionally has taken it on a daily basis until his symptoms  worsen and then he resumes twice a day therapy. He did try to come off  medication but after 3 days he had a significant return of symptoms. He  notes no dysphagia, odynophagia, weight loss, abdominal pain, melena or  hematochezia.   CURRENT MEDICATIONS:  1. Multivitamin daily.  2. Nexium 40 mg b.i.d.  3. Folic acid daily.  4. Tums p.r.n.   MEDICATION ALLERGIES:  SULFA DRUGS.   PHYSICAL EXAMINATION:  GENERAL:  No acute distress.  VITAL SIGNS:  Weight 259.8 pounds. Blood pressure is 120/76, pulse 80  and regular.  CHEST:  Clear to auscultation bilaterally.  CARDIAC:  Regular rate and rhythm without murmurs.  ABDOMEN:  Soft and nontender with normal active bowel sounds.   ASSESSMENT/PLAN:  Gastroesophageal reflux disease. Maintain standard  antireflux measures and Nexium 40 mg p.o. b.i.d. with doses taken 30  minutes before breakfast and dinner. He can certainly decrease to q.a.m.  therapy as he tolerates and he can try to wean himself by decreasing to  Nexium every other day for 10 days and then every third day for 10 days.  Plan for return office visit with me in 1 year.     Venita Lick. Russella Dar, MD, [facility]  Electronically Signed    MTS/MedQ  DD: 08/25/2006  DT: 08/25/2006  Job #: [id number]

## 2010-10-05 NOTE — Discharge Summary (Signed)
NAME:  Victor Craig, Victor Craig NO.:  [id number]   MEDICAL RECORD NO.:  [id number]          PATIENT TYPE:  INP   LOCATION:  1320                         FACILITY:  [facility]   PHYSICIAN:  Malcolm T. Russella Dar, M.D. [facility] OF BIRTH:  1975/10/13   DATE OF ADMISSION:  12/11/2004  DATE OF DISCHARGE:  12/14/2004                                 DISCHARGE SUMMARY   ADMITTING DIAGNOSES.:  25.  35 year old male with acute epigastric pain, status post endoscopic      retrograde cholangiopancreatography and sphincterotomy, rule out spasm,      rule out post endoscopic retrograde cholangiopancreatography      pancreatitis.  2.  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  3.  Status post laparoscopic cholecystectomy, August 2005.   DISCHARGE DIAGNOSES:  1.  Resolving post endoscopic retrograde cholangiopancreatography      pancreatitis.  2.  Mild hypokalemia secondary to above.  3.  Fever secondary to above.   CONSULTATIONS:  None.   PROCEDURES:  Day of admission ERCP with sphincterotomy per Dr. Russella Dar.   BRIEF HISTORY:  Victor Craig is a 35 year old white male who has had a history of  recurrent episodic epigastric pain over the past several months. He relates  that this pain is severe and incapacitating and doubles him over. Sometimes,  however, it seems to be relieved by Tums and other times Tums does not seem  to touch the pain. He has been on Nexium 40 mg p.o. daily for reflux and  over the past several months has been on b.i.d. Nexium without any change in  his symptoms. He had undergone a laparoscopic cholecystectomy in August 2005  per Dr. Evette Cristal for similar symptoms but has continued to have pain. He did  not have any gallstones noted at the time of his surgery, did have a bit low  gallbladder ejection fraction. His last two episodes of pain have been  associated with documented elevations in his liver function studies and  therefore the  decision was made to proceed with ERCP and sphincterotomy for  probable sphincter a Oddi dysfunction. He had ERCP on the day of admission  which was negative, sphincterotomy was done. He tolerated the procedure well  but complained of epigastric pain post procedure which has persisted. The  patient rated the pain is an 8/10 scale on a scale of 1-10. He has not had  any associated nausea or vomiting. He was admitted for observation and pain  control and to rule out post ERCP pancreatitis.   LABORATORY STUDIES:  On admission WBC of 9, hemoglobin 14, hematocrit of  41.3, MCV of 86. Serial values were obtained on the 27th, WBC was 9.4,  hemoglobin 14.4, hematocrit of 41.6. Potassium was 4 on admission, BUN 6,  creatinine 1.5. Liver function studies on admission SGOT 34, SGPT 49, total  bilirubin of 0.7. Amylase was 2127 and lipase 3583. Serial values were done  on July 27, amylase is 681, lipase of 382 and on July 28 amylase 233, lipase  of 68. His potassium on July 28  slightly low at 3.4.   HOSPITAL COURSE:  The patient was admitted to the service of Dr. Claudette Head. He was kept n.p.o., placed on IV fluids due to severe pain which was  poorly controlled with p.r.n. pain medication. He was placed on a morphine  PCA. He had a very stable hospital course, did not have any manifestations  of severe pancreatitis. His liver tests normalized within the first 24 hours  and his amylase and lipase significantly improved on a daily basis. On July  27,  he had one fever and spiked one further fever within the next 24 hours.  He had a chest x-ray done that showed bibasilar atelectasis. We were  gradually able to advance his diet and discontinue the morphine PCA at which  point he was switched to oral Vicodin. By July 28, he was felt stable for  discharge to home and is to follow up with Dr. Russella Dar on August 8 at 9:00  a.m. He is to maintain a low-fat bland diet, no alcohol. He will resume  Nexium 40 mg  p.o. q.a.m. Will give him short-term potassium supplementation,  K-Dur 10 mEq b.i.d. for 3 days and also given a prescription for Vicodin  5/500 one p.o. q. 4-6 hours p.r.n. pain.       AE/MEDQ  D:  12/14/2004  T:  12/14/2004  Job:  [id number]

## 2011-09-01 ENCOUNTER — Other Ambulatory Visit: Payer: Self-pay | Admitting: Gastroenterology

## 2011-09-02 NOTE — Telephone Encounter (Signed)
NEEDS OFFICE VISIT FOR ANY FURTHER REFILLS! 

## 2011-09-03 ENCOUNTER — Telehealth: Payer: Self-pay | Admitting: Gastroenterology

## 2011-09-03 NOTE — Telephone Encounter (Signed)
Called patient with no answer and no voicemail. 

## 2011-09-04 NOTE — Telephone Encounter (Addendum)
Left a message for patient telling him that I called his insurance company yesterday and his  Aciphex prescription was approved already. Told him to call back if he has any other questions.

## 2011-09-23 ENCOUNTER — Encounter: Payer: Self-pay | Admitting: Gastroenterology

## 2011-09-23 ENCOUNTER — Ambulatory Visit (INDEPENDENT_AMBULATORY_CARE_PROVIDER_SITE_OTHER): Payer: BC Managed Care – PPO | Admitting: Gastroenterology

## 2011-09-23 VITALS — BP 110/74 | HR 76 | Ht 71.0 in | Wt 244.6 lb

## 2011-09-23 DIAGNOSIS — K219 Gastro-esophageal reflux disease without esophagitis: Secondary | ICD-10-CM

## 2011-09-23 MED ORDER — RABEPRAZOLE SODIUM 20 MG PO TBEC: 20.0000 mg | DELAYED_RELEASE_TABLET | Freq: Two times a day (BID) | ORAL | Status: DC

## 2011-09-23 NOTE — Patient Instructions (Signed)
We have sent the following medications to your pharmacy for you to pick up at your convenience: Aciphex. cc: [name], MD

## 2011-09-23 NOTE — Progress Notes (Signed)
History of Present Illness: This is a 36 year old male with chronic GERD that is well controlled on AcipHex. He generally takes AcipHex twice a day but at times his symptoms can be controlled on once a day AcipHex. His IBS has been completely inactive and he no longer takes any antispasmodic. Denies weight loss, abdominal pain, constipation, diarrhea, change in stool caliber, melena, hematochezia, nausea, vomiting, dysphagia, reflux symptoms, chest pain.  Current Medications, Allergies, Past Medical History, Past Surgical History, Family History and Social History were reviewed in [employer] record.  Physical Exam: General: Well developed , well nourished, no acute distress Head: Normocephalic and atraumatic Eyes:  sclerae anicteric, EOMI Ears: Normal auditory acuity Mouth: No deformity or lesions Lungs: Clear throughout to auscultation Heart: Regular rate and rhythm; no murmurs, rubs or bruits Abdomen: Soft, non tender and non distended. No masses, hepatosplenomegaly or hernias noted. Normal Bowel sounds Musculoskeletal: Symmetrical with no gross deformities  Pulses:  Normal pulses noted Extremities: No clubbing, cyanosis, edema or deformities noted Neurological: Alert oriented x 4, grossly nonfocal Psychological:  Alert and cooperative. Normal mood and affect  Assessment and Recommendations:  1. GERD. Continue AcipHex 20 mg twice daily. He may decrease to once daily if his symptoms are adequately controlled. Continue standard antireflux measures. Return office visit in one year and as needed.

## 2011-10-15 ENCOUNTER — Other Ambulatory Visit: Payer: Self-pay | Admitting: Gastroenterology

## 2011-10-15 MED ORDER — RABEPRAZOLE SODIUM 20 MG PO TBEC: 20.0000 mg | DELAYED_RELEASE_TABLET | Freq: Two times a day (BID) | ORAL | Status: DC

## 2011-10-15 NOTE — Telephone Encounter (Signed)
Aciphex sent to the pharmacy.

## 2012-01-26 ENCOUNTER — Ambulatory Visit: Payer: Self-pay | Admitting: Medical

## 2012-01-26 LAB — URINALYSIS, COMPLETE
Glucose,UR: NEGATIVE mg/dL (ref 0–75)
Ph: 6 (ref 4.5–8.0)
Specific Gravity: >=1.03 (ref 1.003–1.030)

## 2012-01-26 LAB — CBC WITH DIFFERENTIAL/PLATELET
Basophil %: 0.7 %
Eosinophil #: 0.3 10*3/uL (ref 0.0–0.7)
Eosinophil %: 4.9 %
HCT: 45.8 % (ref 40.0–52.0)
HGB: 15.6 g/dL (ref 13.0–18.0)
Lymphocyte #: 1.5 10*3/uL (ref 1.0–3.6)
Lymphocyte %: 22 %
MCH: 30.7 pg (ref 26.0–34.0)
MCHC: 34 g/dL (ref 32.0–36.0)
MCV: 90 fL (ref 80–100)
Monocyte #: 0.4 x10 3/mm (ref 0.2–1.0)
Monocyte %: 5.9 %
Neutrophil #: 4.6 10*3/uL (ref 1.4–6.5)
RDW: 12.7 % (ref 11.5–14.5)

## 2012-01-26 LAB — COMPREHENSIVE METABOLIC PANEL
Albumin: 4.1 g/dL (ref 3.4–5.0)
Alkaline Phosphatase: 45 U/L — ABNORMAL LOW (ref 50–136)
BUN: 18 mg/dL (ref 7–18)
Bilirubin,Total: 0.4 mg/dL (ref 0.2–1.0)
Calcium, Total: 9.6 mg/dL (ref 8.5–10.1)
Co2: 28 mmol/L (ref 21–32)
EGFR (Non-African Amer.): 58 — ABNORMAL LOW
Glucose: 111 mg/dL — ABNORMAL HIGH (ref 65–99)
Osmolality: 284 (ref 275–301)
SGOT(AST): 28 U/L (ref 15–37)
SGPT (ALT): 52 U/L (ref 12–78)
Sodium: 141 mmol/L (ref 136–145)

## 2012-01-27 ENCOUNTER — Ambulatory Visit: Payer: Self-pay | Admitting: Medical

## 2012-03-15 ENCOUNTER — Ambulatory Visit: Payer: Self-pay | Admitting: Physician Assistant

## 2012-03-15 ENCOUNTER — Emergency Department: Payer: Self-pay | Admitting: Internal Medicine

## 2012-03-15 ENCOUNTER — Ambulatory Visit: Payer: Self-pay

## 2012-03-15 LAB — CBC WITH DIFFERENTIAL/PLATELET
Basophil %: 0.5 %
Eosinophil #: 0.3 10*3/uL (ref 0.0–0.7)
Eosinophil %: 5.8 %
HCT: 48.6 % (ref 40.0–52.0)
HGB: 16.4 g/dL (ref 13.0–18.0)
Lymphocyte %: 22.3 %
MCH: 30.8 pg (ref 26.0–34.0)
MCHC: 33.8 g/dL (ref 32.0–36.0)
MCV: 91 fL (ref 80–100)
Monocyte #: 0.4 x10 3/mm (ref 0.2–1.0)
Neutrophil #: 3.3 10*3/uL (ref 1.4–6.5)
Neutrophil %: 64.5 %
RBC: 5.33 10*6/uL (ref 4.40–5.90)

## 2012-03-15 LAB — URINALYSIS, COMPLETE
Bilirubin,UR: NEGATIVE
Glucose,UR: 150 mg/dL (ref 0–75)
Ketone: NEGATIVE
Leukocyte Esterase: NEGATIVE
Nitrite: NEGATIVE
Ph: 6 (ref 4.5–8.0)
Specific Gravity: 1.033 (ref 1.003–1.030)

## 2012-03-15 LAB — CBC
HGB: 15.5 g/dL (ref 13.0–18.0)
MCH: 30.7 pg (ref 26.0–34.0)
MCHC: 34.5 g/dL (ref 32.0–36.0)
MCV: 89 fL (ref 80–100)
RBC: 5.05 10*6/uL (ref 4.40–5.90)
RDW: 13 % (ref 11.5–14.5)

## 2012-03-15 LAB — COMPREHENSIVE METABOLIC PANEL
Albumin: 4.2 g/dL (ref 3.4–5.0)
Albumin: 4.3 g/dL (ref 3.4–5.0)
Alkaline Phosphatase: 40 U/L — ABNORMAL LOW (ref 50–136)
Alkaline Phosphatase: 43 U/L — ABNORMAL LOW (ref 50–136)
Anion Gap: 11 (ref 7–16)
BUN: 19 mg/dL — ABNORMAL HIGH (ref 7–18)
Calcium, Total: 9.4 mg/dL (ref 8.5–10.1)
Calcium, Total: 8.9 mg/dL (ref 8.5–10.1)
Chloride: 105 mmol/L (ref 98–107)
Co2: 30 mmol/L (ref 21–32)
Creatinine: 1.27 mg/dL (ref 0.60–1.30)
Creatinine: 1.2 mg/dL (ref 0.60–1.30)
EGFR (African American): >60
EGFR (African American): >60
EGFR (Non-African Amer.): >60
EGFR (Non-African Amer.): >60
Glucose: 112 mg/dL — ABNORMAL HIGH (ref 65–99)
Glucose: 162 mg/dL — ABNORMAL HIGH (ref 65–99)
Osmolality: 283 (ref 275–301)
Potassium: 3.9 mmol/L (ref 3.5–5.1)
SGOT(AST): 29 U/L (ref 15–37)
SGPT (ALT): 52 U/L (ref 12–78)
Sodium: 141 mmol/L (ref 136–145)

## 2012-03-15 LAB — PROTIME-INR: Prothrombin Time: 13 secs (ref 11.5–14.7)

## 2012-03-15 LAB — CK TOTAL AND CKMB (NOT AT ARMC)
CK, Total: 327 U/L — ABNORMAL HIGH (ref 35–232)
CK-MB: 2.6 ng/mL (ref 0.5–3.6)

## 2012-03-15 LAB — DRUG SCREEN, URINE
Barbiturates, Ur Screen: NEGATIVE (ref ?–200)
Benzodiazepine, Ur Scrn: NEGATIVE (ref ?–200)
Methadone, Ur Screen: NEGATIVE (ref ?–300)
Tricyclic, Ur Screen: NEGATIVE (ref ?–1000)

## 2012-03-15 LAB — TROPONIN I: Troponin-I: <0.02 ng/mL

## 2012-03-16 ENCOUNTER — Telehealth: Payer: Self-pay | Admitting: Gastroenterology

## 2012-03-16 NOTE — Telephone Encounter (Signed)
Patient was seen at [facility] ER yesterday for nausea and epigastric pain.  He reports that he has a history of pancreatitis years ago.  He reports that ER did not find anything wrong.  They advised him to follow up with his GI.  He will come in and see Willette Cluster RNP tomorrow at 10:00.  I have sent a records release to [facility] to get ER records.

## 2012-03-17 ENCOUNTER — Other Ambulatory Visit (INDEPENDENT_AMBULATORY_CARE_PROVIDER_SITE_OTHER): Payer: BC Managed Care – PPO

## 2012-03-17 ENCOUNTER — Encounter: Payer: Self-pay | Admitting: Nurse Practitioner

## 2012-03-17 ENCOUNTER — Ambulatory Visit (INDEPENDENT_AMBULATORY_CARE_PROVIDER_SITE_OTHER): Payer: BC Managed Care – PPO | Admitting: Nurse Practitioner

## 2012-03-17 VITALS — BP 130/88 | HR 64 | Ht 72.05 in | Wt 242.4 lb

## 2012-03-17 DIAGNOSIS — K219 Gastro-esophageal reflux disease without esophagitis: Secondary | ICD-10-CM

## 2012-03-17 DIAGNOSIS — R1013 Epigastric pain: Secondary | ICD-10-CM

## 2012-03-17 LAB — HEPATIC FUNCTION PANEL
ALT: 32 U/L (ref 0–53)
AST: 20 U/L (ref 0–37)
Albumin: 4.1 g/dL (ref 3.5–5.2)
Bilirubin, Direct: 0.1 mg/dL (ref 0.0–0.3)
Total Bilirubin: 0.7 mg/dL (ref 0.3–1.2)
Total Protein: 7 g/dL (ref 6.0–8.3)

## 2012-03-17 LAB — LIPASE: Lipase: 24 U/L (ref 11.0–59.0)

## 2012-03-17 MED ORDER — HYDROCODONE-ACETAMINOPHEN 5-500 MG PO TABS: 1.0000 | ORAL_TABLET | ORAL | Status: DC | PRN

## 2012-03-17 MED ORDER — METHOCARBAMOL 500 MG PO TABS: ORAL_TABLET | ORAL | Status: DC

## 2012-03-17 NOTE — Patient Instructions (Addendum)
You have been scheduled for an endoscopy with propofol. Please follow written instructions given to you at your visit today. If you use inhalers (even only as needed) or a CPAP machine, please bring them with you on the day of your procedure.  We faxed a prescription for Vicodin to [facility] [place]. We sent a prescription electronically to [facility] [place] for Robaxin. Take as directed. Use Miralax as needed.

## 2012-03-17 NOTE — Progress Notes (Signed)
03/17/2012 Victor Craig [id number] 08-29-1975   History of Present Illness:   Patient is a 36 year old male known to Dr. Russella Dar for GERD. He has been asymptomatic on BID AcipHex. She presents today for evaluation of acute epigastric pain. At 5:50am Sunday morning he work up with severe non-radiating epigastric pain. Pain was constant for an hour. No associated nausea. No fever. He finally went to Urgent Care in [place]. Pain progressed, he got an IM injection of pain medication. Pain eased off but recurred. He was sent to ED in [place]. We requested records which I have reviewed. In the emergency department, vital signs were stable , afebrile. His EKG showed normal sinus rhythm with sinus arrhythmia. CBC was normal. Comprehensive metabolic profile was normal with the exception of mildly elevated BUN of 20.  Lipase normal at 165. Urinalysis showed some mucus and glucose. CK elevated at 327 but CK-MB was normal. Troponin negative. Urine drug screen negative except for opioids but patient had received pain medication at urgent care. Renal ultrasound was unremarkable. CT scan of the chest abdomen and pelvis with contrast negative for any acute abnormalities. Since leaving the emergency department in the afternoon patient has continued to have epigastric pain though to a much lesser degree. He seldom takes NSAIDs. Patient is on chronic PPI therapy. No bowel changes, no black stool.  In July 2006 patient underwent ERCP for evaluation of post cholecystectomy epigastric pain and elevated LFTs. The exam was normal , no stones were removed from the common bile duct. A sphincterotomy was performed .unfortunately patient got post ERCP pancreatitis but once getting over that his original pain resolved. He has not had any epigastric pain until now. Current pain feels like what patient had prior to biliary sphincterotomy.   Current Medications, Allergies, Past Medical History, Past Surgical History, Family History  and Social History were reviewed in [employer] record.   Physical Exam: General: Well developed , white male in no acute distress Head: Normocephalic and atraumatic Eyes:  sclerae anicteric, conjunctiva pink  Ears: Normal auditory acuity Lungs: Clear throughout to auscultation Heart: Regular rate and rhythm Abdomen: Soft, non tender and non distended. No masses, no hepatomegaly. Normal bowel sounds Musculoskeletal: Symmetrical with no gross deformities  Extremities: No edema  Neurological: Alert oriented x 4, grossly nonfocal Psychological:  Alert and cooperative. Normal mood and affect  Assessment and Recommendations: 1. acute nonradiating epigastric pain reminiscent of pain prior to ERCP with sphincterotomy in 2006. This time however patient's LFTs are normal. In fact, all emergency department studies including labs and a contrast CT scan of the abdomen and pelvis are unrevealing. Etiology of symptoms not clear but some possibilities include peptic ulcer disease, sphincter of Oddi dysfunction, musculoskeletal pain. MRCP would be low yield given absence of common bile duct stones in the past . For further evaluation patient will be scheduled for upper endoscopy with Dr. Russella Dar, patient's primary gastroenterologist. In the meantime, patient should continue Vicodin as needed for pain. I will try him on a course of Robaxin in the event this is muscular pain. Repeat LFTs and lipase will be obtained today.  2. GERD, no pyrosis or regurgitation on AcipHex. Continue  PPI.  3. Elevated CK of 327. This may have been secondary to intramuscular injection received at urgent care prior to arriving at [facility] emergency department

## 2012-03-18 NOTE — Progress Notes (Signed)
Reviewed and agree with management plans.  Ruthellen Tippy T. Christeen Lai MD FACG  

## 2012-03-26 ENCOUNTER — Ambulatory Visit (AMBULATORY_SURGERY_CENTER): Payer: BC Managed Care – PPO | Admitting: Gastroenterology

## 2012-03-26 ENCOUNTER — Encounter: Payer: Self-pay | Admitting: Gastroenterology

## 2012-03-26 ENCOUNTER — Encounter: Payer: BC Managed Care – PPO | Admitting: Gastroenterology

## 2012-03-26 VITALS — BP 118/73 | HR 59 | Temp 98.4°F | Resp 16 | Ht 72.0 in | Wt 242.0 lb

## 2012-03-26 DIAGNOSIS — K219 Gastro-esophageal reflux disease without esophagitis: Secondary | ICD-10-CM

## 2012-03-26 DIAGNOSIS — R109 Unspecified abdominal pain: Secondary | ICD-10-CM

## 2012-03-26 DIAGNOSIS — R1013 Epigastric pain: Secondary | ICD-10-CM

## 2012-03-26 MED ORDER — SODIUM CHLORIDE 0.9 % IV SOLN: 500.0000 mL | INTRAVENOUS | Status: DC

## 2012-03-26 NOTE — Op Note (Signed)
[facility] [address] [place], [address]   ENDOSCOPY PROCEDURE REPORT  PATIENT: Victor, Craig  MR#: [id number] BIRTHDATE: [birth year] , 36  yrs. old GENDER: Male ENDOSCOPIST: Meryl Dare, MD, [facility] PROCEDURE DATE:  03/26/2012 PROCEDURE:  EGD, diagnostic ASA CLASS:     Class II INDICATIONS:  epigastric pain,  history of esophageal reflux. MEDICATIONS: MAC sedation, administered by CRNA and propofol (Diprivan) 250mg  IV TOPICAL ANESTHETIC: Cetacaine Spray DESCRIPTION OF PROCEDURE: After the risks benefits and alternatives of the procedure were thoroughly explained, informed consent was obtained.  The LB GIF-H180 [id number] endoscope was introduced through the mouth and advanced to the second portion of the duodenum. Without limitations.  The instrument was slowly withdrawn as the mucosa was fully examined.    ESOPHAGUS: The mucosa of the esophagus appeared normal. STOMACH: The mucosa of the stomach appeared normal. DUODENUM: The duodenal mucosa showed no abnormalities in the bulb and second portion of the duodenum. Retroflexed views revealed no abnormalities.  The scope was then withdrawn from the patient and the procedure completed.  COMPLICATIONS: There were no complications.  ENDOSCOPIC IMPRESSION: 1.   Normal EGD  RECOMMENDATIONS: 1.  anti-reflux regimen 2.  continue PPI   eSigned:  Meryl Dare, MD, [facility] 03/26/2012 4:26 PM

## 2012-03-26 NOTE — Patient Instructions (Addendum)
Discharge instructions given with verbal understanding. Normal exam. Resume previous medications. YOU HAD AN ENDOSCOPIC PROCEDURE TODAY AT THE [facility]: Refer to the procedure report that was given to you for any specific questions about what was found during the examination.  If the procedure report does not answer your questions, please call your gastroenterologist to clarify.  If you requested that your care partner not be given the details of your procedure findings, then the procedure report has been included in a sealed envelope for you to review at your convenience later.  YOU SHOULD EXPECT: Some feelings of bloating in the abdomen. Passage of more gas than usual.  Walking can help get rid of the air that was put into your GI tract during the procedure and reduce the bloating. If you had a lower endoscopy (such as a colonoscopy or flexible sigmoidoscopy) you may notice spotting of blood in your stool or on the toilet paper. If you underwent a bowel prep for your procedure, then you may not have a normal bowel movement for a few days.  DIET: Your first meal following the procedure should be a light meal and then it is ok to progress to your normal diet.  A half-sandwich or bowl of soup is an example of a good first meal.  Heavy or fried foods are harder to digest and may make you feel nauseous or bloated.  Likewise meals heavy in dairy and vegetables can cause extra gas to form and this can also increase the bloating.  Drink plenty of fluids but you should avoid alcoholic beverages for 24 hours.  ACTIVITY: Your care partner should take you home directly after the procedure.  You should plan to take it easy, moving slowly for the rest of the day.  You can resume normal activity the day after the procedure however you should NOT DRIVE or use heavy machinery for 24 hours (because of the sedation medicines used during the test).    SYMPTOMS TO REPORT IMMEDIATELY: A gastroenterologist  can be reached at any hour.  During normal business hours, 8:30 AM to 5:00 PM Monday through Friday, call [phone].  After hours and on weekends, please call the GI answering service at [phone] who will take a message and have the physician on call contact you.   Following upper endoscopy (EGD)  Vomiting of blood or coffee ground material  New chest pain or pain under the shoulder blades  Painful or persistently difficult swallowing  New shortness of breath  Fever of 100F or higher  Black, tarry-looking stools  FOLLOW UP: If any biopsies were taken you will be contacted by phone or by letter within the next 1-3 weeks.  Call your gastroenterologist if you have not heard about the biopsies in 3 weeks.  Our staff will call the home number listed on your records the next business day following your procedure to check on you and address any questions or concerns that you may have at that time regarding the information given to you following your procedure. This is a courtesy call and so if there is no answer at the home number and we have not heard from you through the emergency physician on call, we will assume that you have returned to your regular daily activities without incident.  SIGNATURES/CONFIDENTIALITY: You and/or your care partner have signed paperwork which will be entered into your electronic medical record.  These signatures attest to the fact that that the information above on your After   Visit Summary has been reviewed and is understood.  Full responsibility of the confidentiality of this discharge information lies with you and/or your care-partner. 

## 2012-03-26 NOTE — Progress Notes (Signed)
Propofol per [facility] CRNA, see scanned intra procedure report. ewm

## 2012-03-26 NOTE — Progress Notes (Signed)
Patient did not experience any of the following events: a burn prior to discharge; a fall within the facility; wrong site/side/patient/procedure/implant event; or a hospital transfer or hospital admission upon discharge from the facility. (G8907) Patient did not have preoperative order for IV antibiotic SSI prophylaxis. (G8918)  

## 2012-03-27 ENCOUNTER — Telehealth: Payer: Self-pay | Admitting: *Deleted

## 2012-03-27 NOTE — Telephone Encounter (Signed)
  Follow up Call-  Call back number 03/26/2012  Post procedure Call Back phone  # [phone]  Permission to leave phone message Yes     Patient questions:  Do you have a fever, pain , or abdominal swelling? no Pain Score  0 *  Have you tolerated food without any problems? yes  Have you been able to return to your normal activities? yes  Do you have any questions about your discharge instructions: Diet   no Medications  no Follow up visit  no  Do you have questions or concerns about your Care? no  Actions: * If pain score is 4 or above: No action needed, pain <4.

## 2012-05-25 ENCOUNTER — Telehealth: Payer: Self-pay | Admitting: *Deleted

## 2012-05-25 NOTE — Telephone Encounter (Signed)
Called [employer] at [phone]  I spoke with Lelon Mast and she stated that generic Aciphex(Rabeprazole) will be covered under patients plan with override where as Aciphex will need a prior authorization done. Rabeprazole was approved for one year.  Pharmacy and patient notified.  Fax will come showing approval.  Case number is [id number].

## 2012-07-14 ENCOUNTER — Ambulatory Visit: Payer: Self-pay

## 2012-11-08 ENCOUNTER — Other Ambulatory Visit: Payer: Self-pay | Admitting: Gastroenterology

## 2013-05-19 ENCOUNTER — Other Ambulatory Visit: Payer: Self-pay | Admitting: Gastroenterology

## 2013-06-07 ENCOUNTER — Encounter: Payer: Self-pay | Admitting: Gastroenterology

## 2013-06-07 ENCOUNTER — Ambulatory Visit (INDEPENDENT_AMBULATORY_CARE_PROVIDER_SITE_OTHER): Payer: BC Managed Care – PPO | Admitting: Gastroenterology

## 2013-06-07 VITALS — BP 110/60 | HR 72 | Ht 72.0 in | Wt 247.0 lb

## 2013-06-07 DIAGNOSIS — K219 Gastro-esophageal reflux disease without esophagitis: Secondary | ICD-10-CM

## 2013-06-07 MED ORDER — RABEPRAZOLE SODIUM 20 MG PO TBEC: DELAYED_RELEASE_TABLET | ORAL | Status: DC

## 2013-06-07 NOTE — Patient Instructions (Signed)
We have sent the following medications to your pharmacy for you to pick up at your convenience: Aciphex.   Thank you for choosing me and [facility].  Malcolm T. Stark, Jr., MD., FACG   

## 2013-06-07 NOTE — Progress Notes (Signed)
    History of Present Illness: This is a 38 year old white male with chronic GERD that is well controlled on rabeprazole 20 mg twice daily. He has no GI complaints.  Current Medications, Allergies, Past Medical History, Past Surgical History, Family History and Social History were reviewed in [employer] record.  Physical Exam: General: Well developed , well nourished, no acute distress Head: Normocephalic and atraumatic Eyes:  sclerae anicteric, EOMI Ears: Normal auditory acuity Mouth: No deformity or lesions Lungs: Clear throughout to auscultation Heart: Regular rate and rhythm; no murmurs, rubs or bruits Abdomen: Soft, non tender and non distended. No masses, hepatosplenomegaly or hernias noted. Normal Bowel sounds Musculoskeletal: Symmetrical with no gross deformities  Pulses:  Normal pulses noted Extremities: No clubbing, cyanosis, edema or deformities noted Neurological: Alert oriented x 4, grossly nonfocal Psychological:  Alert and cooperative. Normal mood and affect  Assessment and Recommendations:  1. GERD. Continue rabeprazole 20 mg twice daily and standard antireflux measures.

## 2014-06-14 ENCOUNTER — Ambulatory Visit (INDEPENDENT_AMBULATORY_CARE_PROVIDER_SITE_OTHER): Payer: BC Managed Care – PPO | Admitting: Gastroenterology

## 2014-06-14 ENCOUNTER — Encounter: Payer: Self-pay | Admitting: Gastroenterology

## 2014-06-14 VITALS — BP 120/74 | HR 64 | Ht 71.75 in | Wt 255.4 lb

## 2014-06-14 DIAGNOSIS — K219 Gastro-esophageal reflux disease without esophagitis: Secondary | ICD-10-CM

## 2014-06-14 DIAGNOSIS — R195 Other fecal abnormalities: Secondary | ICD-10-CM

## 2014-06-14 MED ORDER — PEG-KCL-NACL-NASULF-NA ASC-C 100 G PO SOLR: 1.0000 | Freq: Once | ORAL | Status: DC

## 2014-06-14 NOTE — Patient Instructions (Signed)
You have been scheduled for a colonoscopy. Please follow written instructions given to you at your visit today.  Please pick up your prep kit at the pharmacy within the next 1-3 days. If you use inhalers (even only as needed), please bring them with you on the day of your procedure.  Thank you for choosing me and [facility].  Pricilla Riffle. Dagoberto Ligas., MD., Marval Regal  cc: Otilio Miu, MD

## 2014-06-14 NOTE — Progress Notes (Signed)
    History of Present Illness: This is a 39 year old male with asymptomatic Hemoccult positive stool noted on a physical exam. Subsequent home Hemoccults were negative. Patient has chronic GERD which is well controlled. He has no other gastrointestinal complaints. He underwent colonoscopy in 08/2007 which showed a small hyperplastic colon polyp and was otherwise normal. Denies weight loss, abdominal pain, constipation, diarrhea, change in stool caliber, melena, hematochezia, nausea, vomiting, dysphagia, chest pain.  Allergies  Allergen Reactions  . Sulfonamide Derivatives     REACTION: hives   Outpatient Prescriptions Prior to Visit  Medication Sig Dispense Refill  . Nutritional Supplements (JUICE PLUS FIBRE PO) Take by mouth.    Marland Kitchen OVER THE COUNTER MEDICATION creatin monoltydrate powder 5mg /day    . OVER THE COUNTER MEDICATION Protein supplement twice a day    . RABEprazole (ACIPHEX) 20 MG tablet TAKE 1 TABLET BY MOUTH TWICE A DAY 60 tablet 11   No facility-administered medications prior to visit.   Past Medical History  Diagnosis Date  . GERD (gastroesophageal reflux disease)   . Pancreatitis   . IBS (irritable bowel syndrome)   . Hiatal hernia    Past Surgical History  Procedure Laterality Date  . Laparoscopic cholecystectomy    . Sphincterotomy    . Tonsillectomy    . Tricept  reattached Left    History   Social History  . Marital Status: Married    Spouse Name: N/A    Number of Children: 2  . Years of Education: N/A   Occupational History  . teacher    Social History Main Topics  . Smoking status: Never Smoker   . Smokeless tobacco: Never Used  . Alcohol Use: No  . Drug Use: No  . Sexual Activity: None   Other Topics Concern  . None   Social History Narrative   Family History  Problem Relation Age of Onset  . Colon polyps Father   . Prostate cancer Paternal Grandfather   . Heart disease Maternal Grandfather   . Colon cancer Neg Hx     Physical  Exam: General: Well developed , well nourished, no acute distress Head: Normocephalic and atraumatic Eyes:  sclerae anicteric, EOMI Ears: Normal auditory acuity Mouth: No deformity or lesions Lungs: Clear throughout to auscultation Heart: Regular rate and rhythm; no murmurs, rubs or bruits Abdomen: Soft, non tender and non distended. No masses, hepatosplenomegaly or hernias noted. Normal Bowel sounds Rectal: deferred to colonoscopy Musculoskeletal: Symmetrical with no gross deformities  Pulses:  Normal pulses noted Extremities: No clubbing, cyanosis, edema or deformities noted Neurological: Alert oriented x 4, grossly nonfocal Psychological:  Alert and cooperative. Normal mood and affect  Assessment and Recommendations:  1. Heme + stool, asymptomatic. Rule out colorectal neoplasms hemorrhoids and other disorders. Schedule colonoscopy. The risks, benefits, and alternatives to colonoscopy with possible biopsy and possible polypectomy were discussed with the patient and they consent to proceed.   2. GERD. Continue standard antireflux measures and AcipHex 20 mg twice daily.

## 2014-06-20 DIAGNOSIS — K635 Polyp of colon: Secondary | ICD-10-CM

## 2014-06-20 HISTORY — DX: Polyp of colon: K63.5

## 2014-06-21 ENCOUNTER — Encounter: Payer: Self-pay | Admitting: Gastroenterology

## 2014-06-21 ENCOUNTER — Ambulatory Visit (AMBULATORY_SURGERY_CENTER): Payer: BC Managed Care – PPO | Admitting: Gastroenterology

## 2014-06-21 VITALS — BP 102/68 | HR 62 | Temp 96.8°F | Resp 17 | Ht 71.75 in | Wt 255.0 lb

## 2014-06-21 DIAGNOSIS — D122 Benign neoplasm of ascending colon: Secondary | ICD-10-CM

## 2014-06-21 DIAGNOSIS — D123 Benign neoplasm of transverse colon: Secondary | ICD-10-CM

## 2014-06-21 DIAGNOSIS — R195 Other fecal abnormalities: Secondary | ICD-10-CM

## 2014-06-21 HISTORY — PX: COLONOSCOPY: SHX174

## 2014-06-21 MED ORDER — SODIUM CHLORIDE 0.9 % IV SOLN: 500.0000 mL | INTRAVENOUS | Status: DC

## 2014-06-21 NOTE — Progress Notes (Signed)
Called to room to assist during endoscopic procedure.  Patient ID and intended procedure confirmed with present staff. Received instructions for my participation in the procedure from the performing physician.  

## 2014-06-21 NOTE — Op Note (Signed)
[place]  [address] [place], [id number]   COLONOSCOPY PROCEDURE REPORT  PATIENT: Victor Craig, Victor Craig  MR#: [id number] BIRTHDATE: Jul 17, 1975 , 38  yrs. old GENDER: male ENDOSCOPIST: Ladene Artist, MD, [facility] REFERRED PI:RJJOAC Jones, M.D. PROCEDURE DATE:  06/21/2014 PROCEDURE:   Colonoscopy with snare polypectomy First Screening Colonoscopy - Avg.  risk and is 50 yrs.  old or older - No.  Prior Negative Screening - Now for repeat screening. N/A  History of Adenoma - Now for follow-up colonoscopy & has been > or = to 3 yrs.  N/A  Polyps Removed Today? Yes. ASA CLASS:   Class II INDICATIONS:heme-positive stool. MEDICATIONS: Monitored anesthesia care and Propofol 250 mg IV DESCRIPTION OF PROCEDURE:   After the risks benefits and alternatives of the procedure were thoroughly explained, informed consent was obtained.  The digital rectal exam revealed no abnormalities of the rectum.   The LB [id number]  endoscope was introduced through the anus and advanced to the cecum, which was identified by both the appendix and ileocecal valve. No adverse events experienced.   The quality of the prep was excellent, using MoviPrep  The instrument was then slowly withdrawn as the colon was fully examined.    COLON FINDINGS: Two sessile polyps measuring 5 mm in size were found in the transverse colon and ascending colon.  Polypectomies were performed with a cold snare.  The resection was complete, the polyp tissue was completely retrieved and sent to histology.   The examination was otherwise normal.  Retroflexed views revealed internal Grade I hemorrhoids. The time to cecum=2 minutes 00 seconds.  Withdrawal time=9 minutes 55 seconds.  The scope was withdrawn and the procedure completed. COMPLICATIONS: There were no immediate complications.  ENDOSCOPIC IMPRESSION: 1.   Two sessile polyps in the transverse colon and ascending colon; polypectomies performed with a cold  snare 2.   Grade l internal hemorrhoids  RECOMMENDATIONS: 1.  Await pathology results 2.  Repeat colonoscopy in 5 years if polyp(s) adenomatous; otherwise 10 years  eSigned:  Ladene Artist, MD, [facility] 06/21/2014 2:09 PM

## 2014-06-21 NOTE — Patient Instructions (Signed)
YOU HAD AN ENDOSCOPIC PROCEDURE TODAY AT [facility]: Refer to the procedure report that was given to you for any specific questions about what was found during the examination.  If the procedure report does not answer your questions, please call your gastroenterologist to clarify.  If you requested that your care partner not be given the details of your procedure findings, then the procedure report has been included in a sealed envelope for you to review at your convenience later.  YOU SHOULD EXPECT: Some feelings of bloating in the abdomen. Passage of more gas than usual.  Walking can help get rid of the air that was put into your GI tract during the procedure and reduce the bloating. If you had a lower endoscopy (such as a colonoscopy or flexible sigmoidoscopy) you may notice spotting of blood in your stool or on the toilet paper. If you underwent a bowel prep for your procedure, then you may not have a normal bowel movement for a few days.  DIET: Your first meal following the procedure should be a light meal and then it is ok to progress to your normal diet.  A half-sandwich or bowl of soup is an example of a good first meal.  Heavy or fried foods are harder to digest and may make you feel nauseous or bloated.  Likewise meals heavy in dairy and vegetables can cause extra gas to form and this can also increase the bloating.  Drink plenty of fluids but you should avoid alcoholic beverages for 24 hours.Increase the fiber in your diet.  ACTIVITY: Your care partner should take you home directly after the procedure.  You should plan to take it easy, moving slowly for the rest of the day.  You can resume normal activity the day after the procedure however you should NOT DRIVE or use heavy machinery for 24 hours (because of the sedation medicines used during the test).    SYMPTOMS TO REPORT IMMEDIATELY: A gastroenterologist can be reached at any hour.  During normal business hours, 8:30 AM to  5:00 PM Monday through Friday, call [phone].  After hours and on weekends, please call the GI answering service at [phone] who will take a message and have the physician on call contact you.   Following lower endoscopy (colonoscopy or flexible sigmoidoscopy):  Excessive amounts of blood in the stool  Significant tenderness or worsening of abdominal pains  Swelling of the abdomen that is new, acute  Fever of 100F or higher  FOLLOW UP: If any biopsies were taken you will be contacted by phone or by letter within the next 1-3 weeks.  Call your gastroenterologist if you have not heard about the biopsies in 3 weeks.  Our staff will call the home number listed on your records the next business day following your procedure to check on you and address any questions or concerns that you may have at that time regarding the information given to you following your procedure. This is a courtesy call and so if there is no answer at the home number and we have not heard from you through the emergency physician on call, we will assume that you have returned to your regular daily activities without incident.  SIGNATURES/CONFIDENTIALITY: You and/or your care partner have signed paperwork which will be entered into your electronic medical record.  These signatures attest to the fact that that the information above on your After Visit Summary has been reviewed and is understood.  Full responsibility  of the confidentiality of this discharge information lies with you and/or your care-partner.  Read the handouts given to you by your recovery room nurse.

## 2014-06-21 NOTE — Progress Notes (Signed)
Procedure ends, to recovery, report given and VSS. 

## 2014-06-22 ENCOUNTER — Other Ambulatory Visit: Payer: Self-pay | Admitting: Gastroenterology

## 2014-06-22 ENCOUNTER — Telehealth: Payer: Self-pay

## 2014-06-22 NOTE — Telephone Encounter (Signed)
Left a message at [phone] for the pt to call if any questions or concerns. maw

## 2014-06-28 ENCOUNTER — Encounter: Payer: Self-pay | Admitting: Gastroenterology

## 2014-08-09 ENCOUNTER — Telehealth: Payer: Self-pay | Admitting: Gastroenterology

## 2014-08-09 NOTE — Telephone Encounter (Signed)
LM for patient to tell him his PA was approved earlier today for rabeprazole.

## 2014-08-09 NOTE — Progress Notes (Signed)
Patient ID: Victor Craig, male   DOB: [birth year], 39 y.o.   MRN: [id number]   Called [employer] and spoke with Sharyn Lull to get PA for rabeprazole. Medication was approved starting 07/10/14-08/09/15. Faxed approval to pharmacy.

## 2015-04-25 ENCOUNTER — Encounter (HOSPITAL_BASED_OUTPATIENT_CLINIC_OR_DEPARTMENT_OTHER): Payer: Self-pay | Admitting: *Deleted

## 2015-04-25 ENCOUNTER — Other Ambulatory Visit: Payer: Self-pay | Admitting: Physician Assistant

## 2015-04-25 NOTE — H&P (Signed)
This is a pleasant 39 year old gentleman who presents to our clinic today with pain to the right distal triceps.  He states that yesterday he was bench pressing and decided to do an extra set of heavy weights when he all of a sudden felt a pop to his distal triceps. Since then, he has had somewhat of a stinging sensation that extends throughout the entire triceps.  Flexion of the elbow seems to be most bothersome.  He has tried ice with minimal relief of symptoms.  No  radicular symptoms noted.  Of note, he does have a history of surgical intervention of a left triceps complete avulsion following a traumatic injury several years back.  Doing well with that.    Past Medical, Family and Social History are reviewed in detail on patient questionnaire and signed. Review of Systems as detailed in HPI; all others reviewed and are negative.    EXAMINATION: Well-developed, well-nourished gentleman in no acute distress.  Alert and oriented x 3.  Examination of his right upper extremity reveals marked tenderness and defect at the distal triceps right at the olecranon.  He does have increased pain with flexion.  A little bit of swelling noted.  He is neurovascularly intact distally.    X-RAYS: Posterior calcifications to the right elbow.   IMPRESSION: Acute traumatic triceps tear right elbow.  DISPOSITION: Today we proceeded with ultrasound assessment of the right triceps.  This revealed a near complete avulsion of the triceps off the olecranon.  We are going to go ahead and fill out paperwork to proceed with a right triceps repair.  We will be using the [employer] SpeedBridge technique.  Risks, benefits and possible complications are reviewed.  Rehab and recover time discussed.  All questions are answered.  Paperwork completed.  We will see [place]ias at the time of operative intervention.      [name], M.D.

## 2015-04-27 ENCOUNTER — Ambulatory Visit (HOSPITAL_BASED_OUTPATIENT_CLINIC_OR_DEPARTMENT_OTHER): Payer: BC Managed Care – PPO | Admitting: Anesthesiology

## 2015-04-27 ENCOUNTER — Other Ambulatory Visit: Payer: Self-pay

## 2015-04-27 ENCOUNTER — Encounter (HOSPITAL_BASED_OUTPATIENT_CLINIC_OR_DEPARTMENT_OTHER): Payer: Self-pay | Admitting: Anesthesiology

## 2015-04-27 ENCOUNTER — Encounter (HOSPITAL_BASED_OUTPATIENT_CLINIC_OR_DEPARTMENT_OTHER)
Admission: RE | Disposition: A | Discharge status: Home / Self Care | Payer: Self-pay | Source: Ambulatory Visit | Attending: Orthopedic Surgery

## 2015-04-27 ENCOUNTER — Ambulatory Visit (HOSPITAL_BASED_OUTPATIENT_CLINIC_OR_DEPARTMENT_OTHER)
Admission: RE | Admit: 2015-04-27 | Discharge: 2015-04-27 | Disposition: A | Discharge status: Home / Self Care | Payer: BC Managed Care – PPO | Source: Ambulatory Visit | Attending: Orthopedic Surgery | Admitting: Orthopedic Surgery

## 2015-04-27 DIAGNOSIS — K219 Gastro-esophageal reflux disease without esophagitis: Secondary | ICD-10-CM | POA: Insufficient documentation

## 2015-04-27 DIAGNOSIS — M66821 Spontaneous rupture of other tendons, right upper arm: Secondary | ICD-10-CM | POA: Diagnosis present

## 2015-04-27 DIAGNOSIS — Z882 Allergy status to sulfonamides status: Secondary | ICD-10-CM | POA: Insufficient documentation

## 2015-04-27 HISTORY — DX: Strain of muscle, fascia and tendon of triceps, unspecified arm, initial encounter: S46.319A

## 2015-04-27 HISTORY — PX: ULNAR NERVE TRANSPOSITION: SHX2595

## 2015-04-27 HISTORY — PX: TRICEPS TENDON REPAIR: SHX2577

## 2015-04-27 SURGERY — REPAIR, TENDON, TRICEPS
Anesthesia: General | Site: Elbow | Laterality: Right

## 2015-04-27 MED ORDER — ONDANSETRON HCL 4 MG/2ML IJ SOLN
INTRAMUSCULAR | Status: AC
  Filled 2015-04-27: qty 2

## 2015-04-27 MED ORDER — ONDANSETRON HCL 4 MG/2ML IJ SOLN: 4.0000 mg | Freq: Once | INTRAMUSCULAR | Status: DC | PRN

## 2015-04-27 MED ORDER — LIDOCAINE HCL (CARDIAC) 20 MG/ML IV SOLN
INTRAVENOUS | Status: AC
  Filled 2015-04-27: qty 5

## 2015-04-27 MED ORDER — CEFAZOLIN SODIUM-DEXTROSE 2-3 GM-% IV SOLR
2.0000 g | INTRAVENOUS | Status: AC
  Administered 2015-04-27: 2 g via INTRAVENOUS

## 2015-04-27 MED ORDER — GLYCOPYRROLATE 0.2 MG/ML IJ SOLN: 0.2000 mg | Freq: Once | INTRAMUSCULAR | Status: DC | PRN

## 2015-04-27 MED ORDER — BUPIVACAINE HCL (PF) 0.5 % IJ SOLN
INTRAMUSCULAR | Status: DC | PRN
  Administered 2015-04-27: 10 mL

## 2015-04-27 MED ORDER — SCOPOLAMINE 1 MG/3DAYS TD PT72
1.0000 | MEDICATED_PATCH | Freq: Once | TRANSDERMAL | Status: DC
Start: 2015-04-27 — End: 2015-04-27

## 2015-04-27 MED ORDER — FENTANYL CITRATE (PF) 100 MCG/2ML IJ SOLN
50.0000 ug | INTRAMUSCULAR | Status: AC | PRN
  Administered 2015-04-27: 100 ug via INTRAVENOUS
  Administered 2015-04-27 (×2): 25 ug via INTRAVENOUS

## 2015-04-27 MED ORDER — LACTATED RINGERS IV SOLN
INTRAVENOUS | Status: DC
  Administered 2015-04-27: 13:00:00 via INTRAVENOUS

## 2015-04-27 MED ORDER — METHOCARBAMOL 500 MG PO TABS: 500.0000 mg | ORAL_TABLET | Freq: Four times a day (QID) | ORAL | Status: DC

## 2015-04-27 MED ORDER — DEXAMETHASONE SODIUM PHOSPHATE 4 MG/ML IJ SOLN
INTRAMUSCULAR | Status: DC | PRN
  Administered 2015-04-27: 10 mg via INTRAVENOUS

## 2015-04-27 MED ORDER — FENTANYL CITRATE (PF) 100 MCG/2ML IJ SOLN
INTRAMUSCULAR | Status: AC
  Filled 2015-04-27: qty 2

## 2015-04-27 MED ORDER — CEFAZOLIN SODIUM-DEXTROSE 2-3 GM-% IV SOLR
INTRAVENOUS | Status: AC
  Filled 2015-04-27: qty 50

## 2015-04-27 MED ORDER — KETOROLAC TROMETHAMINE 30 MG/ML IJ SOLN
INTRAMUSCULAR | Status: AC
  Filled 2015-04-27: qty 1

## 2015-04-27 MED ORDER — LACTATED RINGERS IV SOLN: INTRAVENOUS | Status: DC

## 2015-04-27 MED ORDER — DEXAMETHASONE SODIUM PHOSPHATE 10 MG/ML IJ SOLN
INTRAMUSCULAR | Status: AC
  Filled 2015-04-27: qty 1

## 2015-04-27 MED ORDER — FENTANYL CITRATE (PF) 100 MCG/2ML IJ SOLN
25.0000 ug | INTRAMUSCULAR | Status: DC | PRN
  Administered 2015-04-27: 50 ug via INTRAVENOUS
  Administered 2015-04-27: 25 ug via INTRAVENOUS

## 2015-04-27 MED ORDER — KETOROLAC TROMETHAMINE 30 MG/ML IJ SOLN
INTRAMUSCULAR | Status: DC | PRN
  Administered 2015-04-27: 30 mg via INTRAVENOUS

## 2015-04-27 MED ORDER — CHLORHEXIDINE GLUCONATE 4 % EX LIQD: 60.0000 mL | Freq: Once | CUTANEOUS | Status: DC

## 2015-04-27 MED ORDER — PROPOFOL 10 MG/ML IV BOLUS
INTRAVENOUS | Status: DC | PRN
  Administered 2015-04-27: 250 mg via INTRAVENOUS

## 2015-04-27 MED ORDER — ONDANSETRON HCL 4 MG PO TABS: 4.0000 mg | ORAL_TABLET | Freq: Three times a day (TID) | ORAL | Status: DC | PRN

## 2015-04-27 MED ORDER — ONDANSETRON HCL 4 MG/2ML IJ SOLN
INTRAMUSCULAR | Status: DC | PRN
  Administered 2015-04-27: 4 mg via INTRAVENOUS

## 2015-04-27 MED ORDER — MIDAZOLAM HCL 2 MG/2ML IJ SOLN
1.0000 mg | INTRAMUSCULAR | Status: DC | PRN
  Administered 2015-04-27: 2 mg via INTRAVENOUS

## 2015-04-27 MED ORDER — BUPIVACAINE HCL (PF) 0.5 % IJ SOLN
INTRAMUSCULAR | Status: AC
  Filled 2015-04-27: qty 30

## 2015-04-27 MED ORDER — OXYCODONE-ACETAMINOPHEN 5-325 MG PO TABS: 1.0000 | ORAL_TABLET | ORAL | Status: DC | PRN

## 2015-04-27 MED ORDER — MIDAZOLAM HCL 2 MG/2ML IJ SOLN
INTRAMUSCULAR | Status: AC
  Filled 2015-04-27: qty 2

## 2015-04-27 MED ORDER — LIDOCAINE HCL (CARDIAC) 20 MG/ML IV SOLN
INTRAVENOUS | Status: DC | PRN
  Administered 2015-04-27: 40 mg via INTRAVENOUS

## 2015-04-27 SURGICAL SUPPLY — 76 items
ANCH SUT 2 CRKSRW FT 14X4.5 (Anchor) ×2 IMPLANT
ANCHOR PEEK CORKSCREW 4.5 (Anchor) ×2 IMPLANT
ANCHOR PEEK CORKSCREW 4.5MM (Anchor) ×1 IMPLANT
APL SKNCLS STERI-STRIP NONHPOA (GAUZE/BANDAGES/DRESSINGS) ×2
BANDAGE ELASTIC 4 VELCRO ST LF (GAUZE/BANDAGES/DRESSINGS) ×8 IMPLANT
BENZOIN TINCTURE PRP APPL 2/3 (GAUZE/BANDAGES/DRESSINGS) ×3 IMPLANT
BIT DRILL 7/64X5 DISP (BIT) ×3 IMPLANT
BLADE SURG 15 STRL LF DISP TIS (BLADE) ×2 IMPLANT
BLADE SURG 15 STRL SS (BLADE) ×4
BNDG CMPR 9X4 STRL LF SNTH (GAUZE/BANDAGES/DRESSINGS) ×2
BNDG COHESIVE 4X5 TAN STRL (GAUZE/BANDAGES/DRESSINGS) ×4 IMPLANT
BNDG ESMARK 4X9 LF (GAUZE/BANDAGES/DRESSINGS) ×4 IMPLANT
CANISTER SUCT 1200ML W/VALVE (MISCELLANEOUS) ×4 IMPLANT
CLOSURE WOUND 1/2 X4 (GAUZE/BANDAGES/DRESSINGS) ×1
COVER BACK TABLE 60X90IN (DRAPES) ×4 IMPLANT
COVER MAYO STAND STRL (DRAPES) ×6 IMPLANT
CUFF TOURNIQUET SINGLE 18IN (TOURNIQUET CUFF) IMPLANT
CUFF TOURNIQUET SINGLE 24IN (TOURNIQUET CUFF) ×3 IMPLANT
DECANTER SPIKE VIAL GLASS SM (MISCELLANEOUS) ×3 IMPLANT
DRAPE EXTREMITY T 121X128X90 (DRAPE) ×4 IMPLANT
DRAPE U 20/CS (DRAPES) ×4 IMPLANT
DRAPE U-SHAPE 47X51 STRL (DRAPES) ×4 IMPLANT
DRSG PAD ABDOMINAL 8X10 ST (GAUZE/BANDAGES/DRESSINGS) ×4 IMPLANT
DURAPREP 26ML APPLICATOR (WOUND CARE) ×4 IMPLANT
ELECT REM PT RETURN 9FT ADLT (ELECTROSURGICAL) ×4
ELECTRODE REM PT RTRN 9FT ADLT (ELECTROSURGICAL) ×2 IMPLANT
GAUZE SPONGE 4X4 12PLY STRL (GAUZE/BANDAGES/DRESSINGS) ×4 IMPLANT
GAUZE XEROFORM 1X8 LF (GAUZE/BANDAGES/DRESSINGS) IMPLANT
GLOVE BIOGEL PI IND STRL 7.0 (GLOVE) ×4 IMPLANT
GLOVE BIOGEL PI INDICATOR 7.0 (GLOVE) ×6
GLOVE ECLIPSE 6.5 STRL STRAW (GLOVE) ×3 IMPLANT
GLOVE ECLIPSE 7.0 STRL STRAW (GLOVE) ×4 IMPLANT
GLOVE SURG ORTHO 8.0 STRL STRW (GLOVE) ×4 IMPLANT
GOWN STRL REUS W/ TWL LRG LVL3 (GOWN DISPOSABLE) ×4 IMPLANT
GOWN STRL REUS W/ TWL XL LVL3 (GOWN DISPOSABLE) ×2 IMPLANT
GOWN STRL REUS W/TWL LRG LVL3 (GOWN DISPOSABLE) ×8
GOWN STRL REUS W/TWL XL LVL3 (GOWN DISPOSABLE) ×5 IMPLANT
NDL 1/2 CIR CATGUT .05X1.09 (NEEDLE) IMPLANT
NDL HYPO 25X1 1.5 SAFETY (NEEDLE) IMPLANT
NDL MAYO TROCAR (NEEDLE) IMPLANT
NEEDLE 1/2 CIR CATGUT .05X1.09 (NEEDLE) IMPLANT
NEEDLE HYPO 25X1 1.5 SAFETY (NEEDLE) IMPLANT
NEEDLE MAYO TROCAR (NEEDLE) ×4 IMPLANT
NS IRRIG 1000ML POUR BTL (IV SOLUTION) ×4 IMPLANT
PACK BASIN DAY SURGERY FS (CUSTOM PROCEDURE TRAY) ×4 IMPLANT
PAD CAST 4YDX4 CTTN HI CHSV (CAST SUPPLIES) ×5 IMPLANT
PADDING CAST ABS 4INX4YD NS (CAST SUPPLIES) ×4
PADDING CAST ABS COTTON 4X4 ST (CAST SUPPLIES) ×3 IMPLANT
PADDING CAST COTTON 4X4 STRL (CAST SUPPLIES) ×8
PENCIL BUTTON HOLSTER BLD 10FT (ELECTRODE) ×4 IMPLANT
SLEEVE SCD COMPRESS KNEE MED (MISCELLANEOUS) ×3 IMPLANT
SLING ARM FOAM STRAP LRG (SOFTGOODS) IMPLANT
SLING ARM FOAM STRAP XLG (SOFTGOODS) ×3 IMPLANT
SLING ARM MED ADULT FOAM STRAP (SOFTGOODS) IMPLANT
SPLINT FAST PLASTER 5X30 (CAST SUPPLIES) ×40
SPLINT PLASTER CAST FAST 5X30 (CAST SUPPLIES) ×20 IMPLANT
SPONGE LAP 4X18 X RAY DECT (DISPOSABLE) ×3 IMPLANT
STOCKINETTE 4X48 STRL (DRAPES) ×4 IMPLANT
STRIP CLOSURE SKIN 1/2X4 (GAUZE/BANDAGES/DRESSINGS) ×2 IMPLANT
SUCTION FRAZIER TIP 10 FR DISP (SUCTIONS) ×4 IMPLANT
SUT 2 FIBERLOOP 20 STRT BLUE (SUTURE)
SUT FIBERWIRE #2 38 T-5 BLUE (SUTURE)
SUT VIC AB 0 CT1 27 (SUTURE)
SUT VIC AB 0 CT1 27XBRD ANBCTR (SUTURE) ×1 IMPLANT
SUT VIC AB 2-0 SH 27 (SUTURE) ×4
SUT VIC AB 2-0 SH 27XBRD (SUTURE) ×2 IMPLANT
SUT VICRYL 3-0 CR8 SH (SUTURE) IMPLANT
SUTURE 2 FIBERLOOP 20 STRT BLU (SUTURE) IMPLANT
SUTURE FIBERWR #2 38 T-5 BLUE (SUTURE) IMPLANT
SYR BULB 3OZ (MISCELLANEOUS) ×4 IMPLANT
TOWEL OR 17X24 6PK STRL BLUE (TOWEL DISPOSABLE) ×4 IMPLANT
TOWEL OR NON WOVEN STRL DISP B (DISPOSABLE) ×4 IMPLANT
TUBE CONNECTING 20'X1/4 (TUBING) ×1
TUBE CONNECTING 20X1/4 (TUBING) ×3 IMPLANT
UNDERPAD 30X30 (UNDERPADS AND DIAPERS) ×1 IMPLANT
YANKAUER SUCT BULB TIP NO VENT (SUCTIONS) ×3 IMPLANT

## 2015-04-27 NOTE — Anesthesia Procedure Notes (Signed)
Procedure Name: LMA Insertion Performed by: Terrance Mass Pre-anesthesia Checklist: Patient identified, Emergency Drugs available, Suction available and Patient being monitored Patient Re-evaluated:Patient Re-evaluated prior to inductionOxygen Delivery Method: Circle System Utilized Preoxygenation: Pre-oxygenation with 100% oxygen Intubation Type: IV induction Ventilation: Mask ventilation without difficulty LMA: LMA inserted LMA Size: 5.0 Number of attempts: 1 Airway Equipment and Method: Bite block Placement Confirmation: positive ETCO2 Tube secured with: Tape Dental Injury: Teeth and Oropharynx as per pre-operative assessment

## 2015-04-27 NOTE — Interval H&P Note (Signed)
History and Physical Interval Note:  04/27/2015 7:46 AM  Victor Craig  has presented today for surgery, with the diagnosis of SPONTANEOUS RUPTURE OF OTHER TENDONS,RIGT UPPER  ARM  The various methods of treatment have been discussed with the patient and family. After consideration of risks, benefits and other options for treatment, the patient has consented to  Procedure(s): RIGHT DISTAL BICEPS TENDON REPAIR (Right) as a surgical intervention .  The patient's history has been reviewed, patient examined, no change in status, stable for surgery.  I have reviewed the patient's chart and labs.  Questions were answered to the patient's satisfaction.     [name]

## 2015-04-27 NOTE — Discharge Instructions (Signed)
Wear sling for comfort.  Do not remove splint.  Do not get splint wet.  Follow up appointment in one week.  SEEK MEDICAL CARE IF: You have swelling of your calf or leg.  You develop shortness of breath or chest pain.  You have redness, swelling, or increasing pain in the wound.  There is pus or any unusual drainage coming from the surgical site.  You notice a bad smell coming from the surgical site or dressing.  The surgical site breaks open after sutures or staples have been removed.  There is persistent bleeding from the suture or staple line.  You are getting worse or are not improving.  You have any other questions or concerns.  SEEK IMMEDIATE MEDICAL CARE IF:  You have a fever greater than 101 You develop a rash.  You have difficulty breathing.  You develop any reaction or side effects to medicines given.  Your knee motion is decreasing rather than improving.  MAKE SURE YOU:  Understand these instructions.  Will watch your condition.  Will get help right away if you are not doing well or get worse.     Post Anesthesia Home Care Instructions  Activity: Get plenty of rest for the remainder of the day. A responsible adult should stay with you for 24 hours following the procedure.  For the next 24 hours, DO NOT: -Drive a car -Paediatric nurse -Drink alcoholic beverages -Take any medication unless instructed by your physician -Make any legal decisions or sign important papers.  Meals: Start with liquid foods such as gelatin or soup. Progress to regular foods as tolerated. Avoid greasy, spicy, heavy foods. If nausea and/or vomiting occur, drink only clear liquids until the nausea and/or vomiting subsides. Call your physician if vomiting continues.  Special Instructions/Symptoms: Your throat may feel dry or sore from the anesthesia or the breathing tube placed in your throat during surgery. If this causes discomfort, gargle with warm salt water. The discomfort should disappear  within 24 hours.  If you had a scopolamine patch placed behind your ear for the management of post- operative nausea and/or vomiting:  1. The medication in the patch is effective for 72 hours, after which it should be removed.  Wrap patch in a tissue and discard in the trash. Wash hands thoroughly with soap and water. 2. You may remove the patch earlier than 72 hours if you experience unpleasant side effects which may include dry mouth, dizziness or visual disturbances. 3. Avoid touching the patch. Wash your hands with soap and water after contact with the patch.

## 2015-04-27 NOTE — Anesthesia Postprocedure Evaluation (Signed)
Anesthesia Post Note  Patient: Victor Craig  Procedure(s) Performed: Procedure(s) (LRB): RIGHT DISTAL TRICEPS TENDON REPAIR (Right) ULNAR NERVE DECOMPRESSION (Right)  Patient location during evaluation: PACU Anesthesia Type: General Level of consciousness: awake and alert Pain management: pain level controlled Vital Signs Assessment: post-procedure vital signs reviewed and stable Respiratory status: spontaneous breathing, nonlabored ventilation, respiratory function stable and patient connected to nasal cannula oxygen Cardiovascular status: blood pressure returned to baseline and stable Postop Assessment: no signs of nausea or vomiting Anesthetic complications: no    Last Vitals:  Filed Vitals:   04/27/15 1235 04/27/15 1453  BP: 135/81 107/64  Pulse: 66 85  Temp: 36.4 C 36.6 C  Resp: 20 16    Last Pain:  Filed Vitals:   04/27/15 1502  PainSc: 0-No pain                 Keeton Kassebaum JENNETTE

## 2015-04-27 NOTE — Anesthesia Preprocedure Evaluation (Addendum)
Anesthesia Evaluation  Patient identified by MRN, date of birth, ID band Patient awake    Reviewed: Allergy & Precautions, NPO status , Patient's Chart, lab work & pertinent test results  History of Anesthesia Complications Negative for: history of anesthetic complications  Airway Mallampati: II  TM Distance: >3 FB Neck ROM: Full    Dental no notable dental hx. (+) Dental Advisory Given   Pulmonary neg pulmonary ROS,    Pulmonary exam normal breath sounds clear to auscultation       Cardiovascular negative cardio ROS Normal cardiovascular exam Rhythm:Regular Rate:Normal     Neuro/Psych negative neurological ROS  negative psych ROS   GI/Hepatic Neg liver ROS, GERD  Medicated and Controlled,  Endo/Other  negative endocrine ROS  Renal/GU negative Renal ROS  negative genitourinary   Musculoskeletal negative musculoskeletal ROS (+)   Abdominal   Peds negative pediatric ROS (+)  Hematology negative hematology ROS (+)   Anesthesia Other Findings   Reproductive/Obstetrics negative OB ROS                             Anesthesia Physical Anesthesia Plan  ASA: II  Anesthesia Plan: General   Post-op Pain Management:    Induction: Intravenous  Airway Management Planned: LMA  Additional Equipment:   Intra-op Plan:   Post-operative Plan: Extubation in OR  Informed Consent: I have reviewed the patients History and Physical, chart, labs and discussed the procedure including the risks, benefits and alternatives for the proposed anesthesia with the patient or authorized representative who has indicated his/her understanding and acceptance.   Dental advisory given  Plan Discussed with: CRNA  Anesthesia Plan Comments:         Anesthesia Quick Evaluation

## 2015-04-27 NOTE — Transfer of Care (Signed)
Immediate Anesthesia Transfer of Care Note  Patient: LENOARD BATON  Procedure(s) Performed: Procedure(s): RIGHT DISTAL TRICEPS TENDON REPAIR (Right) ULNAR NERVE DECOMPRESSION (Right)  Patient Location: PACU  Anesthesia Type:General  Level of Consciousness: awake and sedated  Airway & Oxygen Therapy: Patient Spontanous Breathing and Patient connected to face mask oxygen  Post-op Assessment: Report given to RN and Post -op Vital signs reviewed and stable  Post vital signs: Reviewed and stable  Last Vitals:  Filed Vitals:   04/27/15 1235  BP: 135/81  Pulse: 66  Temp: 36.4 C  Resp: 20    Complications: No apparent anesthesia complications

## 2015-04-28 ENCOUNTER — Encounter (HOSPITAL_BASED_OUTPATIENT_CLINIC_OR_DEPARTMENT_OTHER): Payer: Self-pay | Admitting: Orthopedic Surgery

## 2015-04-28 NOTE — Op Note (Signed)
NAME:  Victor Craig, Victor Craig NO.:  [id number]  MEDICAL RECORD NO.:  [id number]  LOCATION:                               FACILITY:  [facility]  PHYSICIAN:  [name], M.D. DATE OF BIRTH:  12-29-1975  DATE OF PROCEDURE:  04/27/2015 DATE OF DISCHARGE:  04/27/2015                              OPERATIVE REPORT   PREOPERATIVE DIAGNOSIS:  Acute avulsion triceps tendon off the olecranon, right elbow.  POSTOPERATIVE DIAGNOSIS:  Acute avulsion triceps tendon off the olecranon, right elbow with extension of tearing over into the retinaculum over the ulnar nerve.  PROCEDURE:  Right elbow exploration, debridement, repair, reattachment of triceps tendon to the olecranon with a 4.5 Bio-anchor and with FiberWeave suture x2.  In situ decompression of ulnar nerve in the cubital tunnel.  SURGEON:  [name], M.D.  ASSISTANT:  Elmyra Ricks, PA, present throughout the entire case and necessary for timely completion of procedure.  ANESTHESIA:  General.  BLOOD LOSS:  Minimal.  SPECIMENS:  None.  CULTURES:  None.  COMPLICATIONS:  None.  DRESSINGS:  Soft compressive, bulky long-arm splint at 80 degrees.  TOURNIQUET TIME:  45 minutes.  DESCRIPTION OF PROCEDURE:  The patient was brought to the operating room and after adequate anesthesia had been obtained, tourniquet applied, upper aspect of the right arm.  Prepped and draped in usual sterile fashion.  Exsanguinated with elevation of Esmarch.  Tourniquet was inflated to 250 mmHg.  Longitudinal incision from the olecranon process. Skin and subcutaneous tissue divided.  The injury identified.  Some of the fibers on the lateral side still intact.  The majority of the tendon ripped off.  This was debrided and immobilized.  The tear on the medial side extended over to the retinaculum over the ulnar nerve.  As a result of this, I did a complete visualization and decompression of the nerve within the cubital tunnel.   That was intact and not injured.  Of note, it was very mobile in that groove, but that did not appear to be part of the acute injury.  It was protected throughout the remaining procedure. I then placed a 4.5 anchor in the roughened olecranon.  FiberWire suture were then weaved well proximally and then back distally and then tied down securing repair with attachment of the triceps in an anatomic position.  Wound irrigated.  Subcu and subcuticular closure.  Margins were injected with Marcaine.  Sterile compressive dressing applied. Long-arm splint.  Anesthesia reversed.  Brought to the recovery room. Tolerated the surgery well.  No complications.     [name], M.D.     DFM/MEDQ  D:  04/27/2015  T:  04/28/2015  Job:  [id number]

## 2015-07-01 ENCOUNTER — Other Ambulatory Visit: Payer: Self-pay | Admitting: Gastroenterology

## 2015-07-29 ENCOUNTER — Other Ambulatory Visit: Payer: Self-pay | Admitting: Gastroenterology

## 2015-09-01 ENCOUNTER — Other Ambulatory Visit: Payer: Self-pay | Admitting: Gastroenterology

## 2015-09-11 ENCOUNTER — Other Ambulatory Visit: Payer: Self-pay | Admitting: Gastroenterology

## 2015-09-27 ENCOUNTER — Telehealth: Payer: Self-pay | Admitting: *Deleted

## 2015-09-27 NOTE — Telephone Encounter (Signed)
Please advise if Dr. Nicki Reaper would accept as a new patient. Mother is a pt of Dr. Nicki Reaper ( Marlaine Hind)

## 2015-09-27 NOTE — Telephone Encounter (Signed)
ok 

## 2015-11-04 ENCOUNTER — Other Ambulatory Visit: Payer: Self-pay | Admitting: Gastroenterology

## 2015-11-06 ENCOUNTER — Ambulatory Visit: Payer: BC Managed Care – PPO | Admitting: Gastroenterology

## 2015-11-14 ENCOUNTER — Ambulatory Visit (INDEPENDENT_AMBULATORY_CARE_PROVIDER_SITE_OTHER): Payer: BC Managed Care – PPO | Admitting: Gastroenterology

## 2015-11-14 ENCOUNTER — Encounter: Payer: Self-pay | Admitting: Gastroenterology

## 2015-11-14 VITALS — BP 102/70 | HR 78 | Ht 71.75 in | Wt 246.0 lb

## 2015-11-14 DIAGNOSIS — Z86010 Personal history of colonic polyps: Secondary | ICD-10-CM

## 2015-11-14 DIAGNOSIS — K219 Gastro-esophageal reflux disease without esophagitis: Secondary | ICD-10-CM

## 2015-11-14 MED ORDER — RABEPRAZOLE SODIUM 20 MG PO TBEC: 20.0000 mg | DELAYED_RELEASE_TABLET | Freq: Every day | ORAL | Status: DC

## 2015-11-14 NOTE — Patient Instructions (Signed)
We have sent the following medications to your pharmacy for you to pick up at your convenience: Aciphex 20 mg daily for 1 year.   Follow up as needed.

## 2015-11-14 NOTE — Progress Notes (Signed)
    History of Present Illness: This is a 40 year old male with chronic GERD returning for follow-up. Over the past several years he has required rabeprazole 20 mg twice daily to control his reflux symptoms. Over the past few months he has been eating a healthier diet and lower fat foods and he has been able to decrease rabeprazole 20 mg daily with very good control of his reflux symptoms.  Current Medications, Allergies, Past Medical History, Past Surgical History, Family History and Social History were reviewed in [facility] record.  Physical Exam: General: Well developed, well nourished, no acute distress Head: Normocephalic and atraumatic Eyes:  sclerae anicteric, EOMI Ears: Normal auditory acuity Mouth: No deformity or lesions Lungs: Clear throughout to auscultation Heart: Regular rate and rhythm; no murmurs, rubs or bruits Abdomen: Soft, non tender and non distended. No masses, hepatosplenomegaly or hernias noted. Normal Bowel sounds Musculoskeletal: Symmetrical with no gross deformities  Pulses:  Normal pulses noted Extremities: No clubbing, cyanosis, edema or deformities noted Neurological: Alert oriented x 4, grossly nonfocal Psychological:  Alert and cooperative. Normal mood and affect  Assessment and Recommendations:  1. GERD. Follow all standard antireflux measures. Maintain a healthy diet. Continue rabeprazole 20 mg daily. REV in 1 year.  2. Personal history of adenomatous colon polyps. Five-year interval surveillance colonoscopy is recommended in February 2021.   I spent 15 minutes of face-to-face time with the patient. Greater than 50% of the time was spent counseling and coordinating care.

## 2016-01-01 ENCOUNTER — Encounter: Payer: Self-pay | Admitting: Internal Medicine

## 2016-01-01 ENCOUNTER — Encounter (INDEPENDENT_AMBULATORY_CARE_PROVIDER_SITE_OTHER): Payer: Self-pay

## 2016-01-01 ENCOUNTER — Ambulatory Visit (INDEPENDENT_AMBULATORY_CARE_PROVIDER_SITE_OTHER): Payer: BC Managed Care – PPO | Admitting: Internal Medicine

## 2016-01-01 DIAGNOSIS — K219 Gastro-esophageal reflux disease without esophagitis: Secondary | ICD-10-CM

## 2016-01-01 DIAGNOSIS — Z86010 Personal history of colonic polyps: Secondary | ICD-10-CM | POA: Diagnosis not present

## 2016-01-01 DIAGNOSIS — Z8669 Personal history of other diseases of the nervous system and sense organs: Secondary | ICD-10-CM

## 2016-01-01 MED ORDER — SERTRALINE HCL 50 MG PO TABS: ORAL_TABLET | ORAL | 1 refills | Status: DC

## 2016-01-01 NOTE — Progress Notes (Signed)
Patient ID: Victor Craig, male   DOB: [birth year], 40 y.o.   MRN: [id number]   Subjective:    Patient ID: Victor Craig, male    DOB: 1975/11/20, 40 y.o.   MRN: [id number]  HPI  Patient here to establish care.  Has a history of migraine headaches.  States has been present since college.  May have one headache q 2.5 years.  Will noticed left eye blurry and the develops right side headache.  excedrin migraine helps.  Last headache Memorial Day.  Has a history of adenomatous polyps.  Last colonoscopy in 2016.  F/u planned in 2021.  This was performed secondary to noticing blood in his stool.  No further bleeding.  Bowels stable.  No abdominal pain or cramping.  Has a history of acid reflux.  Evaluated by GI.  aciphex controls.  Has seen Dr Fuller Plan.  Is active.  Exercises.  No chest pain.  No sob. Does report increased stress.  States gets angry easier.  Feels needs something to help level things off.  Discussed treatment.  Discussed counseling.     Past Medical History:  Diagnosis Date  . GERD (gastroesophageal reflux disease)   . Hiatal hernia   . Hx of migraines   . IBS (irritable bowel syndrome)   . Pancreatitis   . Serrated polyp of colon 06/2014  . Triceps tendon rupture    right   Past Surgical History:  Procedure Laterality Date  . LAPAROSCOPIC CHOLECYSTECTOMY    . SPHINCTEROTOMY    . TONSILLECTOMY    . TRICEPS TENDON REPAIR Right 04/27/2015   Procedure: RIGHT DISTAL TRICEPS TENDON REPAIR;  Surgeon: [name], MD;  Location: [facility];  Service: Orthopedics;  Laterality: Right;  . tricept  reattached Left   . ULNAR NERVE TRANSPOSITION Right 04/27/2015   Procedure: ULNAR NERVE DECOMPRESSION;  Surgeon: [name], MD;  Location: [facility];  Service: Orthopedics;  Laterality: Right;   Family History  Problem Relation Age of Onset  . Colon polyps Father   . Prostate cancer Paternal Grandfather   . Heart disease Maternal Grandfather   .  Hypertension Maternal Grandfather   . Heart disease Mother   . Hypertension Mother   . Colon cancer Neg Hx    Social History   Social History  . Marital status: Married    Spouse name: N/A  . Number of children: 2  . Years of education: N/A   Occupational History  . teacher [employer]   Social History Main Topics  . Smoking status: Never Smoker  . Smokeless tobacco: Never Used  . Alcohol use No  . Drug use: No  . Sexual activity: Not Asked   Other Topics Concern  . None   Social History Narrative  . None    Outpatient Encounter Prescriptions as of 01/01/2016  Medication Sig  . Nutritional Supplements (JUICE PLUS FIBRE PO) Take by mouth.  Marland Kitchen OVER THE COUNTER MEDICATION Protein supplement twice a day  . RABEprazole (ACIPHEX) 20 MG tablet Take 1 tablet (20 mg total) by mouth daily.  . [DISCONTINUED] methocarbamol (ROBAXIN) 500 MG tablet Take 1 tablet (500 mg total) by mouth 4 (four) times daily.  . [DISCONTINUED] RABEprazole (ACIPHEX) 20 MG tablet TAKE 1 TABLET BY MOUTH TWICE A DAY   No facility-administered encounter medications on file as of 01/01/2016.     Review of Systems  Constitutional: Negative for appetite change and unexpected weight change.  HENT:  Negative for congestion and sinus pressure.   Respiratory: Negative for cough, chest tightness and shortness of breath.   Cardiovascular: Negative for chest pain, palpitations and leg swelling.  Gastrointestinal: Negative for abdominal pain, diarrhea, nausea and vomiting.  Genitourinary: Negative for difficulty urinating and dysuria.  Musculoskeletal: Negative for back pain.       Previous triceps injury.  Saw ortho 04/2015.  Doing well.   Skin: Negative for color change and rash.  Neurological: Negative for dizziness and light-headedness.       History of migraine headaches.    Psychiatric/Behavioral: Negative for dysphoric mood.       Increased stress as outlined.         Objective:      Blood pressure rechecked by me:  118/72  Physical Exam  Constitutional: He appears well-developed and well-nourished. No distress.  HENT:  Nose: Nose normal.  Mouth/Throat: Oropharynx is clear and moist.  Neck: Neck supple. No thyromegaly present.  Cardiovascular: Normal rate and regular rhythm.   Pulmonary/Chest: Effort normal and breath sounds normal. No respiratory distress.  Abdominal: Soft. Bowel sounds are normal. There is no tenderness.  Musculoskeletal: He exhibits no edema or tenderness.  Lymphadenopathy:    He has no cervical adenopathy.  Skin: No rash noted. No erythema.  Psychiatric: He has a normal mood and affect. His behavior is normal.    BP 120/80   Pulse 70   Temp 98.2 F (36.8 C) (Oral)   Resp 18   Ht 5\' 11"  (1.803 m)   Wt 248 lb (112.5 kg)   SpO2 96%   BMI 34.59 kg/m  Wt Readings from Last 3 Encounters:  01/01/16 248 lb (112.5 kg)  11/14/15 246 lb (111.6 kg)  04/27/15 249 lb (112.9 kg)     Lab Results  Component Value Date   WBC 9.5 03/15/2012   HGB 15.5 03/15/2012   HCT 45.0 03/15/2012   PLT 181 03/15/2012   GLUCOSE 162 (H) 03/15/2012   ALT 32 03/17/2012   AST 20 03/17/2012   NA 141 03/15/2012   K 3.5 03/15/2012   CL 105 03/15/2012   CREATININE 1.20 03/15/2012   BUN 19 (H) 03/15/2012   CO2 25 03/15/2012   INR 0.9 03/15/2012       Assessment & Plan:   Problem List Items Addressed This Visit    None    Visit Diagnoses   None.      Einar Pheasant, MD

## 2016-01-01 NOTE — Progress Notes (Signed)
Pre-visit discussion using our clinic review tool. No additional management support is needed unless otherwise documented below in the visit note.  

## 2016-01-03 ENCOUNTER — Telehealth: Payer: Self-pay

## 2016-01-03 DIAGNOSIS — Z125 Encounter for screening for malignant neoplasm of prostate: Secondary | ICD-10-CM

## 2016-01-03 DIAGNOSIS — K219 Gastro-esophageal reflux disease without esophagitis: Secondary | ICD-10-CM

## 2016-01-03 DIAGNOSIS — Z13220 Encounter for screening for lipoid disorders: Secondary | ICD-10-CM

## 2016-01-03 NOTE — Telephone Encounter (Signed)
Orders placed for labs

## 2016-01-03 NOTE — Telephone Encounter (Signed)
Pt coming for fasting labs 01/04/16. Please place future orders. Thank you.

## 2016-01-04 ENCOUNTER — Other Ambulatory Visit (INDEPENDENT_AMBULATORY_CARE_PROVIDER_SITE_OTHER): Payer: BC Managed Care – PPO

## 2016-01-04 ENCOUNTER — Encounter: Payer: Self-pay | Admitting: Internal Medicine

## 2016-01-04 DIAGNOSIS — Z13220 Encounter for screening for lipoid disorders: Secondary | ICD-10-CM

## 2016-01-04 DIAGNOSIS — Z125 Encounter for screening for malignant neoplasm of prostate: Secondary | ICD-10-CM | POA: Diagnosis not present

## 2016-01-04 DIAGNOSIS — K219 Gastro-esophageal reflux disease without esophagitis: Secondary | ICD-10-CM

## 2016-01-04 LAB — BASIC METABOLIC PANEL
BUN: 19 mg/dL (ref 6–23)
CALCIUM: 9.8 mg/dL (ref 8.4–10.5)
CO2: 29 mEq/L (ref 19–32)
Chloride: 105 mEq/L (ref 96–112)
Creatinine, Ser: 1.1 mg/dL (ref 0.40–1.50)
GFR: 77.84 mL/min (ref >60.00)
Glucose, Bld: 89 mg/dL (ref 70–99)
POTASSIUM: 4.4 meq/L (ref 3.5–5.1)
SODIUM: 140 meq/L (ref 135–145)

## 2016-01-04 LAB — CBC WITH DIFFERENTIAL/PLATELET
BASOS ABS: 0 10*3/uL (ref 0.0–0.1)
Basophils Relative: 0.5 % (ref 0.0–3.0)
Eosinophils Absolute: 0.2 10*3/uL (ref 0.0–0.7)
Eosinophils Relative: 3.8 % (ref 0.0–5.0)
HEMATOCRIT: 45.6 % (ref 39.0–52.0)
HEMOGLOBIN: 15.7 g/dL (ref 13.0–17.0)
LYMPHS PCT: 30.4 % (ref 12.0–46.0)
Lymphs Abs: 1.5 10*3/uL (ref 0.7–4.0)
MCHC: 34.4 g/dL (ref 30.0–36.0)
MCV: 88.1 fl (ref 78.0–100.0)
MONOS PCT: 7.4 % (ref 3.0–12.0)
Monocytes Absolute: 0.4 10*3/uL (ref 0.1–1.0)
NEUTROS ABS: 2.8 10*3/uL (ref 1.4–7.7)
Neutrophils Relative %: 57.9 % (ref 43.0–77.0)
PLATELETS: 174 10*3/uL (ref 150.0–400.0)
RBC: 5.18 Mil/uL (ref 4.22–5.81)
RDW: 12.9 % (ref 11.5–15.5)
WBC: 4.9 10*3/uL (ref 4.0–10.5)

## 2016-01-04 LAB — HEPATIC FUNCTION PANEL
ALBUMIN: 4.7 g/dL (ref 3.5–5.2)
ALT: 49 U/L (ref 0–53)
AST: 30 U/L (ref 0–37)
Alkaline Phosphatase: 23 U/L — ABNORMAL LOW (ref 39–117)
Bilirubin, Direct: 0.2 mg/dL (ref 0.0–0.3)
TOTAL PROTEIN: 6.8 g/dL (ref 6.0–8.3)
Total Bilirubin: 0.7 mg/dL (ref 0.2–1.2)

## 2016-01-04 LAB — LIPID PANEL
CHOL/HDL RATIO: 5
Cholesterol: 181 mg/dL (ref 0–200)
HDL: 37.7 mg/dL — AB (ref >39.00)
LDL CALC: 124 mg/dL — AB (ref 0–99)
NonHDL: 143.67
TRIGLYCERIDES: 96 mg/dL (ref 0.0–149.0)
VLDL: 19.2 mg/dL (ref 0.0–40.0)

## 2016-01-04 LAB — TSH: TSH: 1.76 u[IU]/mL (ref 0.35–4.50)

## 2016-01-04 LAB — PSA: PSA: 0.39 ng/mL (ref 0.10–4.00)

## 2016-01-05 ENCOUNTER — Encounter: Payer: Self-pay | Admitting: Internal Medicine

## 2016-01-09 ENCOUNTER — Encounter: Payer: Self-pay | Admitting: Internal Medicine

## 2016-01-09 DIAGNOSIS — Z8669 Personal history of other diseases of the nervous system and sense organs: Secondary | ICD-10-CM | POA: Insufficient documentation

## 2016-01-09 NOTE — Assessment & Plan Note (Signed)
Chronic GERD.  Symptoms controlled on aciphex.  Has seen GI.

## 2016-01-09 NOTE — Assessment & Plan Note (Signed)
History of migraine headaches as outlined.  May occur once q 2.5 years.  Takes excedrin migraine.

## 2016-01-09 NOTE — Assessment & Plan Note (Signed)
Colonoscopy 2016 - adenomatous polyps.  Recommended f/u colonoscopy in 2021.

## 2016-01-23 ENCOUNTER — Encounter: Payer: Self-pay | Admitting: Internal Medicine

## 2016-03-12 ENCOUNTER — Ambulatory Visit (INDEPENDENT_AMBULATORY_CARE_PROVIDER_SITE_OTHER): Payer: BC Managed Care – PPO | Admitting: Internal Medicine

## 2016-03-12 ENCOUNTER — Encounter: Payer: Self-pay | Admitting: Internal Medicine

## 2016-03-12 VITALS — BP 122/80 | HR 67 | Temp 98.0°F | Ht 71.0 in | Wt 253.2 lb

## 2016-03-12 DIAGNOSIS — K219 Gastro-esophageal reflux disease without esophagitis: Secondary | ICD-10-CM | POA: Diagnosis not present

## 2016-03-12 DIAGNOSIS — R0981 Nasal congestion: Secondary | ICD-10-CM | POA: Diagnosis not present

## 2016-03-12 DIAGNOSIS — F439 Reaction to severe stress, unspecified: Secondary | ICD-10-CM | POA: Diagnosis not present

## 2016-03-12 DIAGNOSIS — Z860100 Personal history of colon polyps, unspecified: Secondary | ICD-10-CM

## 2016-03-12 NOTE — Patient Instructions (Signed)
Saline nasal spray - flush nose at least 2-3x/day  nasacort nasal spray - 2 sprays each nostril one time per day.  Do this in the evening.  

## 2016-03-12 NOTE — Progress Notes (Signed)
Pre visit review using our clinic review tool, if applicable. No additional management support is needed unless otherwise documented below in the visit note. 

## 2016-03-12 NOTE — Progress Notes (Signed)
Patient ID: Victor Craig, male   DOB: 09-18-1975, 40 y.o.   MRN: [id number]   Subjective:    Patient ID: Victor Craig, male    DOB: 1975-07-08, 40 y.o.   MRN: [id number]  HPI  Patient here for a scheduled follow up.  States he is doing well.  Feels good.  No chest pain.  No sob.  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  Previously started on zoloft.  Does not feel needs.  Not taking now.  Overall feels he is doing ok on no medication.  Discussed continued diet and exercise.  Follow.  Does have nasal congestion.  Using afrin nasal spray daily.  Discussed quitting.  Discussed using nasacort.     Past Medical History:  Diagnosis Date  . GERD (gastroesophageal reflux disease)   . Hiatal hernia   . Hx of migraines   . IBS (irritable bowel syndrome)   . Pancreatitis   . Serrated polyp of colon 06/2014  . Triceps tendon rupture    right   Past Surgical History:  Procedure Laterality Date  . LAPAROSCOPIC CHOLECYSTECTOMY    . SPHINCTEROTOMY    . TONSILLECTOMY    . TRICEPS TENDON REPAIR Right 04/27/2015   Procedure: RIGHT DISTAL TRICEPS TENDON REPAIR;  Surgeon: [name], MD;  Location: [facility];  Service: Orthopedics;  Laterality: Right;  . tricept  reattached Left   . ULNAR NERVE TRANSPOSITION Right 04/27/2015   Procedure: ULNAR NERVE DECOMPRESSION;  Surgeon: [name], MD;  Location: [facility];  Service: Orthopedics;  Laterality: Right;   Family History  Problem Relation Age of Onset  . Colon polyps Father   . Prostate cancer Paternal Grandfather   . Heart disease Maternal Grandfather   . Hypertension Maternal Grandfather   . Heart disease Mother   . Hypertension Mother   . Colon cancer Neg Hx    Social History   Social History  . Marital status: Married    Spouse name: N/A  . Number of children: 2  . Years of education: N/A   Occupational History  . teacher [employer]   Social History Main Topics  . Smoking  status: Never Smoker  . Smokeless tobacco: Never Used  . Alcohol use No  . Drug use: No  . Sexual activity: Not Asked   Other Topics Concern  . None   Social History Narrative  . None    Outpatient Encounter Prescriptions as of 03/12/2016  Medication Sig  . Nutritional Supplements (JUICE PLUS FIBRE PO) Take by mouth.  Marland Kitchen OVER THE COUNTER MEDICATION Protein supplement twice a day  . RABEprazole (ACIPHEX) 20 MG tablet Take 1 tablet (20 mg total) by mouth daily.  . [DISCONTINUED] sertraline (ZOLOFT) 50 MG tablet 1/2 tablet q day x 10 days and then one q day   No facility-administered encounter medications on file as of 03/12/2016.     Review of Systems  Constitutional: Negative for appetite change and unexpected weight change.  HENT: Negative for congestion and sinus pressure.   Respiratory: Negative for cough, chest tightness and shortness of breath.   Cardiovascular: Negative for chest pain, palpitations and leg swelling.  Gastrointestinal: Negative for abdominal pain, diarrhea, nausea and vomiting.  Genitourinary: Negative for difficulty urinating and dysuria.  Musculoskeletal: Negative for back pain and joint swelling.  Skin: Negative for color change and rash.  Neurological: Negative for dizziness, light-headedness and headaches.  Psychiatric/Behavioral: Negative for agitation and dysphoric mood.  Objective:    Physical Exam  Constitutional: He appears well-developed and well-nourished. No distress.  HENT:  Nose: Nose normal.  Mouth/Throat: Oropharynx is clear and moist.  Neck: Neck supple. No thyromegaly present.  Cardiovascular: Normal rate and regular rhythm.   Pulmonary/Chest: Effort normal and breath sounds normal. No respiratory distress.  Abdominal: Soft. Bowel sounds are normal. There is no tenderness.  Musculoskeletal: He exhibits no edema or tenderness.  Lymphadenopathy:    He has no cervical adenopathy.  Skin: No rash noted. No erythema.    Psychiatric: He has a normal mood and affect. His behavior is normal.    BP 122/80   Pulse 67   Temp 98 F (36.7 C) (Oral)   Ht 5\' 11"  (1.803 m)   Wt 253 lb 3.2 oz (114.9 kg)   SpO2 98%   BMI 35.31 kg/m  Wt Readings from Last 3 Encounters:  03/12/16 253 lb 3.2 oz (114.9 kg)  01/01/16 248 lb (112.5 kg)  11/14/15 246 lb (111.6 kg)     Lab Results  Component Value Date   WBC 4.9 01/04/2016   HGB 15.7 01/04/2016   HCT 45.6 01/04/2016   PLT 174.0 01/04/2016   GLUCOSE 89 01/04/2016   CHOL 181 01/04/2016   TRIG 96.0 01/04/2016   HDL 37.70 (L) 01/04/2016   LDLCALC 124 (H) 01/04/2016   ALT 49 01/04/2016   AST 30 01/04/2016   NA 140 01/04/2016   K 4.4 01/04/2016   CL 105 01/04/2016   CREATININE 1.10 01/04/2016   BUN 19 01/04/2016   CO2 29 01/04/2016   TSH 1.76 01/04/2016   PSA 0.39 01/04/2016   INR 0.9 03/15/2012       Assessment & Plan:   Problem List Items Addressed This Visit    GERD    Controlled on aciphex.  Follow.       History of colonic polyps    Colonoscopy 2016.  Recommended f/u 2021.        Stress    Off zoloft now.  Was started last visit.  Does not feel needs.  Overall doing ok.  Follow.        Other Visit Diagnoses    Nasal congestion    -  Primary   have him use saline nasal spray and nasacort nasal spray as outlined.  stop afrin.  follow.         Einar Pheasant, MD

## 2016-03-17 ENCOUNTER — Encounter: Payer: Self-pay | Admitting: Internal Medicine

## 2016-03-17 DIAGNOSIS — F439 Reaction to severe stress, unspecified: Secondary | ICD-10-CM | POA: Insufficient documentation

## 2016-03-17 NOTE — Assessment & Plan Note (Signed)
Controlled on aciphex.  Follow.  

## 2016-03-17 NOTE — Assessment & Plan Note (Signed)
Off zoloft now.  Was started last visit.  Does not feel needs.  Overall doing ok.  Follow.

## 2016-03-17 NOTE — Assessment & Plan Note (Signed)
Colonoscopy 2016.  Recommended f/u 2021.

## 2016-03-18 MED ORDER — FLUTICASONE PROPIONATE 50 MCG/ACT NA SUSP: 2.0000 | Freq: Every day | NASAL | 3 refills | Status: DC

## 2016-03-18 NOTE — Telephone Encounter (Signed)
rx sent to pharmacy for flonase with 3 refills.

## 2016-04-13 ENCOUNTER — Encounter: Payer: Self-pay | Admitting: Internal Medicine

## 2016-09-13 ENCOUNTER — Encounter: Payer: Self-pay | Admitting: Internal Medicine

## 2016-09-13 ENCOUNTER — Ambulatory Visit (INDEPENDENT_AMBULATORY_CARE_PROVIDER_SITE_OTHER): Payer: BC Managed Care – PPO | Admitting: Internal Medicine

## 2016-09-13 DIAGNOSIS — F439 Reaction to severe stress, unspecified: Secondary | ICD-10-CM | POA: Diagnosis not present

## 2016-09-13 DIAGNOSIS — K219 Gastro-esophageal reflux disease without esophagitis: Secondary | ICD-10-CM

## 2016-09-13 DIAGNOSIS — Z86010 Personal history of colonic polyps: Secondary | ICD-10-CM

## 2016-09-13 MED ORDER — RABEPRAZOLE SODIUM 20 MG PO TBEC: 20.0000 mg | DELAYED_RELEASE_TABLET | Freq: Two times a day (BID) | ORAL | 1 refills | Status: DC

## 2016-09-13 NOTE — Progress Notes (Signed)
Pre-visit discussion using our clinic review tool. No additional management support is needed unless otherwise documented below in the visit note.  

## 2016-09-13 NOTE — Progress Notes (Signed)
Patient ID: Victor Craig, male   DOB: 1976-01-17, 41 y.o.   MRN: [id number]   Subjective:    Patient ID: Victor Craig, male    DOB: 1976-04-16, 41 y.o.   MRN: [id number]  HPI  Patient here for a scheduled follow up.  States he is doing well.  Stress is better.  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  Stays active.  No chest pain.  No sob.  No acid reflux. On aciphex.  No abdominal pain.  Bowels moving.  Overall feels he is doing well.     Past Medical History:  Diagnosis Date  . GERD (gastroesophageal reflux disease)   . Hiatal hernia   . Hx of migraines   . IBS (irritable bowel syndrome)   . Pancreatitis   . Serrated polyp of colon 06/2014  . Triceps tendon rupture    right   Past Surgical History:  Procedure Laterality Date  . LAPAROSCOPIC CHOLECYSTECTOMY    . SPHINCTEROTOMY    . TONSILLECTOMY    . TRICEPS TENDON REPAIR Right 04/27/2015   Procedure: RIGHT DISTAL TRICEPS TENDON REPAIR;  Surgeon: [name], MD;  Location: [place];  Service: Orthopedics;  Laterality: Right;  . tricept  reattached Left   . ULNAR NERVE TRANSPOSITION Right 04/27/2015   Procedure: ULNAR NERVE DECOMPRESSION;  Surgeon: [name], MD;  Location: [place];  Service: Orthopedics;  Laterality: Right;   Family History  Problem Relation Age of Onset  . Colon polyps Father   . Prostate cancer Paternal Grandfather   . Heart disease Maternal Grandfather   . Hypertension Maternal Grandfather   . Heart disease Mother   . Hypertension Mother   . Colon cancer Neg Hx    Social History   Social History  . Marital status: Married    Spouse name: N/A  . Number of children: 2  . Years of education: N/A   Occupational History  . teacher [employer]   Social History Main Topics  . Smoking status: Never Smoker  . Smokeless tobacco: Never Used  . Alcohol use No  . Drug use: No  . Sexual activity: Not Asked   Other Topics Concern  . None    Social History Narrative  . None    Outpatient Encounter Prescriptions as of 09/13/2016  Medication Sig  . fluticasone (FLONASE) 50 MCG/ACT nasal spray Place 2 sprays into both nostrils daily.  Marland Kitchen OVER THE COUNTER MEDICATION Protein supplement twice a day  . RABEprazole (ACIPHEX) 20 MG tablet Take 1 tablet (20 mg total) by mouth 2 (two) times daily before a meal.  . [DISCONTINUED] RABEprazole (ACIPHEX) 20 MG tablet Take 1 tablet (20 mg total) by mouth daily.  . [DISCONTINUED] Nutritional Supplements (JUICE PLUS FIBRE PO) Take by mouth.   No facility-administered encounter medications on file as of 09/13/2016.     Review of Systems  Constitutional: Negative for appetite change and unexpected weight change.  HENT: Negative for congestion and sinus pressure.   Respiratory: Negative for cough, chest tightness and shortness of breath.   Cardiovascular: Negative for chest pain, palpitations and leg swelling.  Gastrointestinal: Negative for abdominal pain, diarrhea, nausea and vomiting.  Genitourinary: Negative for difficulty urinating and dysuria.  Musculoskeletal: Negative for back pain and joint swelling.  Skin: Negative for color change and rash.  Neurological: Negative for dizziness, light-headedness and headaches.  Psychiatric/Behavioral: Negative for agitation and dysphoric mood.       Objective:  Physical Exam  Constitutional: He appears well-developed and well-nourished. No distress.  HENT:  Nose: Nose normal.  Mouth/Throat: Oropharynx is clear and moist.  Neck: Neck supple. No thyromegaly present.  Cardiovascular: Normal rate and regular rhythm.   Pulmonary/Chest: Effort normal and breath sounds normal. No respiratory distress.  Abdominal: Soft. Bowel sounds are normal. There is no tenderness.  Musculoskeletal: He exhibits no edema or tenderness.  Lymphadenopathy:    He has no cervical adenopathy.  Skin: No rash noted. No erythema.  Psychiatric: He has a normal mood  and affect. His behavior is normal.    BP 120/72 (BP Location: Left Arm, Patient Position: Sitting, Cuff Size: Normal)   Pulse 70   Temp 98.6 F (37 C) (Oral)   Resp 12   Ht 5\' 11"  (1.803 m)   Wt 253 lb 12.8 oz (115.1 kg)   SpO2 97%   BMI 35.40 kg/m  Wt Readings from Last 3 Encounters:  09/13/16 253 lb 12.8 oz (115.1 kg)  03/12/16 253 lb 3.2 oz (114.9 kg)  01/01/16 248 lb (112.5 kg)     Lab Results  Component Value Date   WBC 4.9 01/04/2016   HGB 15.7 01/04/2016   HCT 45.6 01/04/2016   PLT 174.0 01/04/2016   GLUCOSE 89 01/04/2016   CHOL 181 01/04/2016   TRIG 96.0 01/04/2016   HDL 37.70 (L) 01/04/2016   LDLCALC 124 (H) 01/04/2016   ALT 49 01/04/2016   AST 30 01/04/2016   NA 140 01/04/2016   K 4.4 01/04/2016   CL 105 01/04/2016   CREATININE 1.10 01/04/2016   BUN 19 01/04/2016   CO2 29 01/04/2016   TSH 1.76 01/04/2016   PSA 0.39 01/04/2016   INR 0.9 03/15/2012       Assessment & Plan:   Problem List Items Addressed This Visit    GERD    Controlled on aciphex.  Follow.       Relevant Medications   RABEprazole (ACIPHEX) 20 MG tablet   History of colonic polyps    Colonoscopy 2016.  Recommended f/u in 2021.        Stress    Off zoloft.  Discussed with him today.  He does not feel needs anything at this point.  We discussed the possibility of buspar.  Follow.            Einar Pheasant, MD

## 2016-09-22 ENCOUNTER — Encounter: Payer: Self-pay | Admitting: Internal Medicine

## 2016-09-22 NOTE — Assessment & Plan Note (Signed)
Off zoloft.  Discussed with him today.  He does not feel needs anything at this point.  We discussed the possibility of buspar.  Follow.

## 2016-09-22 NOTE — Assessment & Plan Note (Signed)
Controlled on aciphex.  Follow.  

## 2016-09-22 NOTE — Assessment & Plan Note (Signed)
Colonoscopy 2016.  Recommended f/u in 2021.

## 2016-10-10 ENCOUNTER — Encounter: Payer: Self-pay | Admitting: Internal Medicine

## 2016-10-11 NOTE — Telephone Encounter (Signed)
Please call and let him know that I do not mind taking her as a new pt, but my establish care appts are booked out.

## 2017-01-26 ENCOUNTER — Encounter: Payer: Self-pay | Admitting: Internal Medicine

## 2017-01-26 ENCOUNTER — Ambulatory Visit
Admission: EM | Admit: 2017-01-26 | Discharge: 2017-01-26 | Disposition: A | Discharge status: Home / Self Care | Payer: BC Managed Care – PPO | Attending: Family Medicine | Admitting: Family Medicine

## 2017-01-26 ENCOUNTER — Encounter: Payer: Self-pay | Admitting: Gynecology

## 2017-01-26 DIAGNOSIS — R1031 Right lower quadrant pain: Secondary | ICD-10-CM | POA: Diagnosis not present

## 2017-01-26 DIAGNOSIS — R1032 Left lower quadrant pain: Secondary | ICD-10-CM | POA: Diagnosis not present

## 2017-01-26 DIAGNOSIS — N5082 Scrotal pain: Secondary | ICD-10-CM

## 2017-01-26 DIAGNOSIS — M545 Low back pain, unspecified: Secondary | ICD-10-CM

## 2017-01-26 LAB — URINALYSIS, COMPLETE (UACMP) WITH MICROSCOPIC
BILIRUBIN URINE: NEGATIVE
Bacteria, UA: NONE SEEN
Glucose, UA: NEGATIVE mg/dL
HGB URINE DIPSTICK: NEGATIVE
Ketones, ur: NEGATIVE mg/dL
Leukocytes, UA: NEGATIVE
NITRITE: NEGATIVE
Protein, ur: NEGATIVE mg/dL
RBC / HPF: NONE SEEN RBC/hpf (ref 0–5)
SPECIFIC GRAVITY, URINE: 1.025 (ref 1.005–1.030)
WBC, UA: NONE SEEN WBC/hpf (ref 0–5)
pH: 5.5 (ref 5.0–8.0)

## 2017-01-26 MED ORDER — DICLOFENAC SODIUM 75 MG PO TBEC: 75.0000 mg | DELAYED_RELEASE_TABLET | Freq: Two times a day (BID) | ORAL | 0 refills | Status: DC | PRN

## 2017-01-26 NOTE — ED Provider Notes (Signed)
[facility]    CSN: [id number] Arrival date & time: 01/26/17  1336     History   Chief Complaint Chief Complaint  Patient presents with  . Urinary Tract Infection    HPI Victor Craig is a 41 y.o. male.   41 year old male presents with lower bilateral back pain that started about a week ago. Pain has now traveled to lower abdominal area and into scrotum. Denies any fever, flank pain, dysuria, hematuria, penile discharge, penile or testicular pain. Denies any nausea, vomiting, diarrhea or constipation. No history of previous UTI. Dad had similar symptoms in the past and was dx with UTI. Does lift weights but has not noticed any change in symptoms with exercise/activity. Has taken Aleve and Advil with some relief. Only chronic health issue is GERD and takes Aciphex daily.    The history is provided by the patient.    Past Medical History:  Diagnosis Date  . GERD (gastroesophageal reflux disease)   . Hiatal hernia   . Hx of migraines   . IBS (irritable bowel syndrome)   . Pancreatitis   . Serrated polyp of colon 06/2014  . Triceps tendon rupture    right    Patient Active Problem List   Diagnosis Date Noted  . Stress 03/17/2016  . History of migraine headaches 01/09/2016  . DIARRHEA 07/31/2009  . History of colonic polyps 07/31/2009  . NAUSEA 06/21/2009  . ABDOMINAL PAIN-MULTIPLE SITES 06/21/2009  . GERD 10/11/2008    Past Surgical History:  Procedure Laterality Date  . LAPAROSCOPIC CHOLECYSTECTOMY    . SPHINCTEROTOMY    . TONSILLECTOMY    . TRICEPS TENDON REPAIR Right 04/27/2015   Procedure: RIGHT DISTAL TRICEPS TENDON REPAIR;  Surgeon: [name], MD;  Location: [place];  Service: Orthopedics;  Laterality: Right;  . tricept  reattached Left   . ULNAR NERVE TRANSPOSITION Right 04/27/2015   Procedure: ULNAR NERVE DECOMPRESSION;  Surgeon: [name], MD;  Location: [place];  Service: Orthopedics;   Laterality: Right;       Home Medications    Prior to Admission medications   Medication Sig Start Date End Date Taking? Authorizing Provider  OVER THE COUNTER MEDICATION Protein supplement twice a day   Yes [provider]  RABEprazole (ACIPHEX) 20 MG tablet Take 1 tablet (20 mg total) by mouth 2 (two) times daily before a meal. 09/13/16  Yes Einar Pheasant, MD  diclofenac (VOLTAREN) 75 MG EC tablet Take 1 tablet (75 mg total) by mouth 2 (two) times daily as needed for moderate pain. 01/26/17   Katy Apo, NP    Family History Family History  Problem Relation Age of Onset  . Colon polyps Father   . Prostate cancer Paternal Grandfather   . Heart disease Maternal Grandfather   . Hypertension Maternal Grandfather   . Heart disease Mother   . Hypertension Mother   . Colon cancer Neg Hx     Social History Social History  Substance Use Topics  . Smoking status: Never Smoker  . Smokeless tobacco: Never Used  . Alcohol use No     Allergies   Sulfonamide derivatives   Review of Systems Review of Systems  Constitutional: Negative for activity change, appetite change, chills, fatigue and fever.  HENT: Negative for mouth sores and sore throat.   Respiratory: Negative for cough, chest tightness, shortness of breath and wheezing.   Cardiovascular: Negative for chest pain, palpitations and leg  swelling.  Gastrointestinal: Positive for abdominal pain. Negative for anal bleeding, blood in stool, constipation, diarrhea, nausea, rectal pain and vomiting.  Genitourinary: Negative for decreased urine volume, difficulty urinating, discharge, dysuria, flank pain, frequency, genital sores, hematuria, penile pain, penile swelling, scrotal swelling, testicular pain and urgency.  Musculoskeletal: Positive for back pain. Negative for arthralgias, joint swelling, myalgias and neck pain.  Skin: Negative for rash and wound.  Neurological: Negative for dizziness, tremors, syncope,  weakness, light-headedness, numbness and headaches.  Hematological: Negative for adenopathy. Does not bruise/bleed easily.  Psychiatric/Behavioral: Negative.      Physical Exam Triage Vital Signs ED Triage Vitals  Enc Vitals Group     BP 01/26/17 1348 (!) 146/88     Pulse Rate 01/26/17 1348 70     Resp 01/26/17 1348 16     Temp 01/26/17 1348 98 F (36.7 C)     Temp Source 01/26/17 1348 Oral     SpO2 01/26/17 1348 98 %     Weight 01/26/17 1347 250 lb (113.4 kg)     Height 01/26/17 1347 5\' 11"  (1.803 m)     Head Circumference --      Peak Flow --      Pain Score 01/26/17 1347 4     Pain Loc --      Pain Edu? --      Excl. in [place]? --    No data found.   Updated Vital Signs BP (!) 146/88 (BP Location: Left Arm)   Pulse 70   Temp 98 F (36.7 C) (Oral)   Resp 16   Ht 5\' 11"  (1.803 m)   Wt 250 lb (113.4 kg)   SpO2 98%   BMI 34.87 kg/m   Visual Acuity Right Eye Distance:   Left Eye Distance:   Bilateral Distance:    Right Eye Near:   Left Eye Near:    Bilateral Near:     Physical Exam  Constitutional: He is oriented to person, place, and time. He appears well-developed and well-nourished. No distress.  HENT:  Head: Normocephalic.  Eyes: Conjunctivae and EOM are normal.  Neck: Normal range of motion. Neck supple.  Cardiovascular: Normal rate, regular rhythm and normal heart sounds.   No murmur heard. Pulmonary/Chest: Effort normal and breath sounds normal. No respiratory distress.  Abdominal: Soft. Bowel sounds are normal. He exhibits no distension, no abdominal bruit and no mass. There is no hepatosplenomegaly. There is no tenderness. There is no rigidity, no rebound, no guarding and no CVA tenderness. No hernia.  Genitourinary:  Genitourinary Comments: Patient declines hernia exam and genital exam  Musculoskeletal: Normal range of motion. He exhibits no tenderness.       Lumbar back: He exhibits pain. He exhibits normal range of motion, no tenderness, no bony  tenderness, no swelling, no edema, no laceration and no spasm.       Back:  Neurological: He is alert and oriented to person, place, and time. He has normal strength. No sensory deficit.  Skin: Skin is warm and dry. No rash noted.  Psychiatric: He has a normal mood and affect. His behavior is normal. Judgment and thought content normal.     UC Treatments / Results  Labs (all labs ordered are listed, but only abnormal results are displayed) Labs Reviewed  URINALYSIS, COMPLETE (UACMP) WITH MICROSCOPIC - Abnormal; Notable for the following:       Result Value   Squamous Epithelial / LPF 0-5 (*)    All other components within  normal limits  URINE CULTURE    EKG  EKG Interpretation None       Radiology No results found.  Procedures Procedures (including critical care time)  Medications Ordered in UC Medications - No data to display   Initial Impression / Assessment and Plan / UC Course  I have reviewed the triage vital signs and the nursing notes.  Pertinent labs & imaging results that were available during my care of the patient were reviewed by me and considered in my medical decision making (see chart for details).    Reviewed urinalysis results with patient- does not appear to have a UTI. Will send urine for culture for confirmation. Patient experiences intermittent pain but no tenderness on exam. Discussed that without performing inguinal exam- uncertain if he may have a hernia. Although would expect increase in pain or other symptoms during exercise/lifting weights if hernia was present. Reviewed that he may have a mild lower back and pelvic muscle strain. Recommend trial Voltaren 75mg  twice a day as directed. Continue to monitor symptoms. Avoid lifting weights or any strenuous exercise for the next 5 days. If pain and symptoms do not improve within the next 2 to 3 days, recommend contact Urologist (information provided) for further evaluation.    Final Clinical  Impressions(s) / UC Diagnoses   Final diagnoses:  Abdominal discomfort, bilateral lower quadrant  Acute bilateral low back pain without sciatica    New Prescriptions Discharge Medication List as of 01/26/2017  2:58 PM    START taking these medications   Details  diclofenac (VOLTAREN) 75 MG EC tablet Take 1 tablet (75 mg total) by mouth 2 (two) times daily as needed for moderate pain., Starting Sun 01/26/2017, Normal         Controlled Substance Prescriptions [place] Controlled Substance Registry consulted? Not Applicable   Katy Apo, NP 01/27/17 0930

## 2017-01-26 NOTE — ED Triage Notes (Signed)
Patient c/o lower abdomen pain and lower back pain x over a week.

## 2017-01-26 NOTE — Discharge Instructions (Signed)
Recommend trial Voltaren 75mg  twice a day as directed for pain. Increase fluid intake. Continue to monitor symptoms. If pain and symptoms do not improve within next 2 to 3 days, recommend follow-up with a Urologist for further evaluation.

## 2017-01-27 LAB — URINE CULTURE
Culture: NO GROWTH
Special Requests: NORMAL

## 2017-01-28 NOTE — Telephone Encounter (Signed)
Order placed for urology referral.  

## 2017-02-11 ENCOUNTER — Ambulatory Visit: Payer: BC Managed Care – PPO | Admitting: Internal Medicine

## 2017-03-21 ENCOUNTER — Encounter: Payer: BC Managed Care – PPO | Admitting: Internal Medicine

## 2017-03-21 ENCOUNTER — Other Ambulatory Visit: Payer: Self-pay | Admitting: Internal Medicine

## 2017-04-15 ENCOUNTER — Ambulatory Visit (INDEPENDENT_AMBULATORY_CARE_PROVIDER_SITE_OTHER): Payer: BC Managed Care – PPO | Admitting: Internal Medicine

## 2017-04-15 ENCOUNTER — Encounter: Payer: Self-pay | Admitting: Internal Medicine

## 2017-04-15 ENCOUNTER — Ambulatory Visit (INDEPENDENT_AMBULATORY_CARE_PROVIDER_SITE_OTHER): Payer: BC Managed Care – PPO

## 2017-04-15 VITALS — BP 134/80 | HR 76 | Temp 97.3°F | Resp 17 | Ht 70.87 in | Wt 260.0 lb

## 2017-04-15 DIAGNOSIS — Z13220 Encounter for screening for lipoid disorders: Secondary | ICD-10-CM

## 2017-04-15 DIAGNOSIS — Z86010 Personal history of colonic polyps: Secondary | ICD-10-CM | POA: Diagnosis not present

## 2017-04-15 DIAGNOSIS — K219 Gastro-esophageal reflux disease without esophagitis: Secondary | ICD-10-CM

## 2017-04-15 DIAGNOSIS — M545 Low back pain, unspecified: Secondary | ICD-10-CM

## 2017-04-15 DIAGNOSIS — F439 Reaction to severe stress, unspecified: Secondary | ICD-10-CM

## 2017-04-15 DIAGNOSIS — Z0000 Encounter for general adult medical examination without abnormal findings: Secondary | ICD-10-CM | POA: Diagnosis not present

## 2017-04-15 DIAGNOSIS — M549 Dorsalgia, unspecified: Secondary | ICD-10-CM | POA: Insufficient documentation

## 2017-04-15 DIAGNOSIS — Z125 Encounter for screening for malignant neoplasm of prostate: Secondary | ICD-10-CM | POA: Diagnosis not present

## 2017-04-15 LAB — BASIC METABOLIC PANEL
BUN: 30 mg/dL — ABNORMAL HIGH (ref 6–23)
CALCIUM: 9.7 mg/dL (ref 8.4–10.5)
CO2: 27 meq/L (ref 19–32)
Chloride: 105 mEq/L (ref 96–112)
Creatinine, Ser: 1.22 mg/dL (ref 0.40–1.50)
GFR: 68.66 mL/min (ref >60.00)
GLUCOSE: 83 mg/dL (ref 70–99)
POTASSIUM: 4.6 meq/L (ref 3.5–5.1)
SODIUM: 140 meq/L (ref 135–145)

## 2017-04-15 LAB — CBC WITH DIFFERENTIAL/PLATELET
BASOS PCT: 0.5 % (ref 0.0–3.0)
Basophils Absolute: 0 10*3/uL (ref 0.0–0.1)
EOS PCT: 4 % (ref 0.0–5.0)
Eosinophils Absolute: 0.2 10*3/uL (ref 0.0–0.7)
HEMATOCRIT: 44.6 % (ref 39.0–52.0)
HEMOGLOBIN: 14.9 g/dL (ref 13.0–17.0)
LYMPHS PCT: 19 % (ref 12.0–46.0)
Lymphs Abs: 1.1 10*3/uL (ref 0.7–4.0)
MCHC: 33.4 g/dL (ref 30.0–36.0)
MCV: 90.3 fl (ref 78.0–100.0)
MONOS PCT: 6.8 % (ref 3.0–12.0)
Monocytes Absolute: 0.4 10*3/uL (ref 0.1–1.0)
NEUTROS ABS: 4.1 10*3/uL (ref 1.4–7.7)
Neutrophils Relative %: 69.7 % (ref 43.0–77.0)
PLATELETS: 156 10*3/uL (ref 150.0–400.0)
RBC: 4.94 Mil/uL (ref 4.22–5.81)
RDW: 12.6 % (ref 11.5–15.5)
WBC: 6 10*3/uL (ref 4.0–10.5)

## 2017-04-15 LAB — LIPID PANEL
CHOL/HDL RATIO: 5
Cholesterol: 161 mg/dL (ref 0–200)
HDL: 31 mg/dL — ABNORMAL LOW (ref >39.00)
LDL CALC: 109 mg/dL — AB (ref 0–99)
NONHDL: 129.98
Triglycerides: 107 mg/dL (ref 0.0–149.0)
VLDL: 21.4 mg/dL (ref 0.0–40.0)

## 2017-04-15 LAB — PSA: PSA: 0.37 ng/mL (ref 0.10–4.00)

## 2017-04-15 LAB — TSH: TSH: 1.74 u[IU]/mL (ref 0.35–4.50)

## 2017-04-15 LAB — HEPATIC FUNCTION PANEL
ALBUMIN: 4.4 g/dL (ref 3.5–5.2)
ALT: 49 U/L (ref 0–53)
AST: 31 U/L (ref 0–37)
Alkaline Phosphatase: 27 U/L — ABNORMAL LOW (ref 39–117)
BILIRUBIN TOTAL: 0.5 mg/dL (ref 0.2–1.2)
Bilirubin, Direct: 0.1 mg/dL (ref 0.0–0.3)
Total Protein: 6.6 g/dL (ref 6.0–8.3)

## 2017-04-15 NOTE — Progress Notes (Signed)
Patient ID: Victor Craig, male   DOB: 1975-09-29, 41 y.o.   MRN: [id number]   Subjective:    Patient ID: Victor Craig, male    DOB: Mar 15, 1976, 41 y.o.   MRN: [id number]  HPI  Patient here for his physical exam.  States he is doing relatively well.  Stays active.  Lifts weights.  Exercises.  No chest pain.  No sob.  No acid reflux.  No abdominal pain.  Bowels moving.  He does report that over the last few months has noticed increased intermittent pain in his left lower back.  The pain will occasionally shoot through to his scrotum.  No pain radiating down leg.  No numbness or tingling.  Able to still exercise and walk.  Flares intermittently.     Past Medical History:  Diagnosis Date  . GERD (gastroesophageal reflux disease)   . Hiatal hernia   . Hx of migraines   . IBS (irritable bowel syndrome)   . Pancreatitis   . Serrated polyp of colon 06/2014  . Triceps tendon rupture    right   Past Surgical History:  Procedure Laterality Date  . LAPAROSCOPIC CHOLECYSTECTOMY    . SPHINCTEROTOMY    . TONSILLECTOMY    . TRICEPS TENDON REPAIR Right 04/27/2015   Procedure: RIGHT DISTAL TRICEPS TENDON REPAIR;  Surgeon: [name], MD;  Location: [place];  Service: Orthopedics;  Laterality: Right;  . tricept  reattached Left   . ULNAR NERVE TRANSPOSITION Right 04/27/2015   Procedure: ULNAR NERVE DECOMPRESSION;  Surgeon: [name], MD;  Location: [place];  Service: Orthopedics;  Laterality: Right;   Family History  Problem Relation Age of Onset  . Colon polyps Father   . Prostate cancer Paternal Grandfather   . Heart disease Maternal Grandfather   . Hypertension Maternal Grandfather   . Heart disease Mother   . Hypertension Mother   . Colon cancer Neg Hx    Social History   Socioeconomic History  . Marital status: Married    Spouse name: None  . Number of children: 2  . Years of education: None  . Highest education level: None  Social  Needs  . Financial resource strain: None  . Food insecurity - worry: None  . Food insecurity - inability: None  . Transportation needs - medical: None  . Transportation needs - non-medical: None  Occupational History  . Occupation: Product manager: [employer]  Tobacco Use  . Smoking status: Never Smoker  . Smokeless tobacco: Never Used  Substance and Sexual Activity  . Alcohol use: No    Alcohol/week: 0.0 oz  . Drug use: No  . Sexual activity: None  Other Topics Concern  . None  Social History Narrative  . None    Outpatient Encounter Medications as of 04/15/2017  Medication Sig  . OVER THE COUNTER MEDICATION Protein supplement twice a day  . RABEprazole (ACIPHEX) 20 MG tablet TAKE 1 TABLET (20 MG TOTAL) BY MOUTH 2 (TWO) TIMES DAILY BEFORE A MEAL.  Marland Kitchen diclofenac (VOLTAREN) 75 MG EC tablet Take 1 tablet (75 mg total) by mouth 2 (two) times daily as needed for moderate pain.   No facility-administered encounter medications on file as of 04/15/2017.     Review of Systems  Constitutional: Negative for appetite change and unexpected weight change.  HENT: Negative for congestion and sinus pressure.   Eyes: Negative for pain and visual disturbance.  Respiratory: Negative  for cough, chest tightness and shortness of breath.   Cardiovascular: Negative for chest pain, palpitations and leg swelling.  Gastrointestinal: Negative for abdominal pain, diarrhea, nausea and vomiting.  Genitourinary: Negative for difficulty urinating and dysuria.  Musculoskeletal: Positive for back pain. Negative for joint swelling.  Skin: Negative for color change and rash.  Neurological: Negative for dizziness, light-headedness and headaches.  Hematological: Negative for adenopathy. Does not bruise/bleed easily.  Psychiatric/Behavioral: Negative for agitation and dysphoric mood.       Objective:     Blood pressure rechecked by me:  120/72  Physical Exam  Constitutional:  He is oriented to person, place, and time. He appears well-developed and well-nourished. No distress.  HENT:  Head: Normocephalic and atraumatic.  Nose: Nose normal.  Mouth/Throat: Oropharynx is clear and moist. No oropharyngeal exudate.  Eyes: Conjunctivae are normal. Right eye exhibits no discharge. Left eye exhibits no discharge.  Neck: Neck supple. No thyromegaly present.  Cardiovascular: Normal rate and regular rhythm.  Pulmonary/Chest: Breath sounds normal. No respiratory distress. He has no wheezes.  Abdominal: Soft. Bowel sounds are normal. There is no tenderness.  Genitourinary:  Genitourinary Comments: Normal descended testicles.  No nodules appreciated.    Musculoskeletal: He exhibits no edema or tenderness.  No back pain with SLR.  No weakness.    Lymphadenopathy:    He has no cervical adenopathy.  Neurological: He is alert and oriented to person, place, and time.  Skin: Skin is warm and dry. No rash noted. No erythema.  Psychiatric: He has a normal mood and affect. His behavior is normal.    BP 134/80 (BP Location: Left Arm, Patient Position: Sitting, Cuff Size: Large)   Pulse 76   Temp (!) 97.3 F (36.3 C) (Oral)   Resp 17   Ht 5' 10.87" (1.8 m)   Wt 260 lb (117.9 kg)   SpO2 96%   BMI 36.40 kg/m  Wt Readings from Last 3 Encounters:  04/15/17 260 lb (117.9 kg)  01/26/17 250 lb (113.4 kg)  09/13/16 253 lb 12.8 oz (115.1 kg)     Lab Results  Component Value Date   WBC 6.0 04/15/2017   HGB 14.9 04/15/2017   HCT 44.6 04/15/2017   PLT 156.0 04/15/2017   GLUCOSE 83 04/15/2017   CHOL 161 04/15/2017   TRIG 107.0 04/15/2017   HDL 31.00 (L) 04/15/2017   LDLCALC 109 (H) 04/15/2017   ALT 49 04/15/2017   AST 31 04/15/2017   NA 140 04/15/2017   K 4.6 04/15/2017   CL 105 04/15/2017   CREATININE 1.22 04/15/2017   BUN 30 (H) 04/15/2017   CO2 27 04/15/2017   TSH 1.74 04/15/2017   PSA 0.37 04/15/2017   INR 0.9 03/15/2012       Assessment & Plan:   Problem  List Items Addressed This Visit    Back pain    Low back pain as outlined.  Shoots through to his testicle as outlined.  Not reproducible on exam.  Check xray given persistent.  Tylenol as needed.      Relevant Orders   CBC with Differential/Platelet (Completed)   TSH (Completed)   Hepatic function panel (Completed)   Basic metabolic panel (Completed)   DG Lumbar Spine 2-3 Views (Completed)   GERD    Controlled on aciphex.  Follow.       Health care maintenance    Physical today 04/15/17.  Colonoscopy 2016.  Recommended f/u colonoscopy in 2021.  Check psa today.  History of colonic polyps - Primary    Colonoscopy 2016.  Recommended f/u colonoscopy in 2021.        Stress    Doing well.  On no medications.  Follow.         Other Visit Diagnoses    Screening cholesterol level       Relevant Orders   Lipid panel (Completed)   Prostate cancer screening       Relevant Orders   PSA (Completed)       Einar Pheasant, MD

## 2017-04-16 ENCOUNTER — Other Ambulatory Visit: Payer: Self-pay | Admitting: Internal Medicine

## 2017-04-16 ENCOUNTER — Encounter: Payer: Self-pay | Admitting: Internal Medicine

## 2017-04-16 DIAGNOSIS — M545 Low back pain, unspecified: Secondary | ICD-10-CM

## 2017-04-16 NOTE — Progress Notes (Signed)
Order placed for referral to Dr Gardenia Phlegm.

## 2017-04-18 ENCOUNTER — Encounter: Payer: Self-pay | Admitting: Internal Medicine

## 2017-04-18 DIAGNOSIS — Z0000 Encounter for general adult medical examination without abnormal findings: Secondary | ICD-10-CM | POA: Insufficient documentation

## 2017-04-18 NOTE — Assessment & Plan Note (Signed)
Colonoscopy 2016.  Recommended f/u colonoscopy in 2021.

## 2017-04-18 NOTE — Assessment & Plan Note (Signed)
Doing well.  On no medications.  Follow.

## 2017-04-18 NOTE — Assessment & Plan Note (Signed)
Physical today 04/15/17.  Colonoscopy 2016.  Recommended f/u colonoscopy in 2021.  Check psa today.

## 2017-04-18 NOTE — Assessment & Plan Note (Signed)
Controlled on aciphex.  Follow.

## 2017-04-18 NOTE — Assessment & Plan Note (Signed)
Low back pain as outlined.  Shoots through to his testicle as outlined.  Not reproducible on exam.  Check xray given persistent.  Tylenol as needed.

## 2017-05-01 ENCOUNTER — Ambulatory Visit: Payer: BC Managed Care – PPO | Admitting: Family Medicine

## 2017-05-10 ENCOUNTER — Encounter: Payer: Self-pay | Admitting: Internal Medicine

## 2017-05-12 ENCOUNTER — Other Ambulatory Visit: Payer: Self-pay

## 2017-05-12 ENCOUNTER — Encounter: Payer: Self-pay | Admitting: *Deleted

## 2017-05-12 ENCOUNTER — Ambulatory Visit
Admission: EM | Admit: 2017-05-12 | Discharge: 2017-05-12 | Disposition: A | Discharge status: Home / Self Care | Payer: BC Managed Care – PPO | Attending: Family Medicine | Admitting: Family Medicine

## 2017-05-12 DIAGNOSIS — H9203 Otalgia, bilateral: Secondary | ICD-10-CM | POA: Diagnosis not present

## 2017-05-12 DIAGNOSIS — R590 Localized enlarged lymph nodes: Secondary | ICD-10-CM

## 2017-05-12 DIAGNOSIS — J029 Acute pharyngitis, unspecified: Secondary | ICD-10-CM

## 2017-05-12 LAB — RAPID STREP SCREEN (MED CTR MEBANE ONLY): STREPTOCOCCUS, GROUP A SCREEN (DIRECT): NEGATIVE

## 2017-05-12 NOTE — Discharge Instructions (Signed)
Motrin as needed.  Take care  Dr. Lacinda Axon

## 2017-05-12 NOTE — ED Provider Notes (Signed)
[facility]    CSN: [id number] Arrival date & time: 05/12/17  0840  History   Chief Complaint Chief Complaint  Patient presents with  . Sore Throat  . Otalgia   HPI  41 year old male presents with soreness of the right side of his neck with swallowing.  He is also had some ear pain.  He reports that this is been going on for the past week to week and a half.  He states it is not really sore throat its pain in the anterior neck with swallowing.  He has had some ear discomfort.  He states that that seems to have subsided currently.  He states that his pain is very minimal, 1/10 in severity.  Only occurs with swallowing.  No known relieving factors.  No fever.  No other associated symptoms.  No other complaints at this time.  Past Medical History:  Diagnosis Date  . GERD (gastroesophageal reflux disease)   . Hiatal hernia   . Hx of migraines   . IBS (irritable bowel syndrome)   . Pancreatitis   . Serrated polyp of colon 06/2014  . Triceps tendon rupture    right    Patient Active Problem List   Diagnosis Date Noted  . Health care maintenance 04/18/2017  . Back pain 04/15/2017  . Stress 03/17/2016  . History of migraine headaches 01/09/2016  . History of colonic polyps 07/31/2009  . ABDOMINAL PAIN-MULTIPLE SITES 06/21/2009  . GERD 10/11/2008    Past Surgical History:  Procedure Laterality Date  . LAPAROSCOPIC CHOLECYSTECTOMY    . SPHINCTEROTOMY    . TONSILLECTOMY    . TRICEPS TENDON REPAIR Right 04/27/2015   Procedure: RIGHT DISTAL TRICEPS TENDON REPAIR;  Surgeon: [name], MD;  Location: [place];  Service: Orthopedics;  Laterality: Right;  . tricept  reattached Left   . ULNAR NERVE TRANSPOSITION Right 04/27/2015   Procedure: ULNAR NERVE DECOMPRESSION;  Surgeon: [name], MD;  Location: [place];  Service: Orthopedics;  Laterality: Right;    Home Medications    Prior to Admission medications   Medication  Sig Start Date End Date Taking? Authorizing Provider  RABEprazole (ACIPHEX) 20 MG tablet TAKE 1 TABLET (20 MG TOTAL) BY MOUTH 2 (TWO) TIMES DAILY BEFORE A MEAL. 03/21/17  Yes Einar Pheasant, MD    Family History Family History  Problem Relation Age of Onset  . Colon polyps Father   . Prostate cancer Paternal Grandfather   . Heart disease Maternal Grandfather   . Hypertension Maternal Grandfather   . Heart disease Mother   . Hypertension Mother   . Colon cancer Neg Hx     Social History Social History   Tobacco Use  . Smoking status: Never Smoker  . Smokeless tobacco: Never Used  Substance Use Topics  . Alcohol use: No    Alcohol/week: 0.0 oz  . Drug use: No     Allergies   Sulfonamide derivatives   Review of Systems Review of Systems  Constitutional: Negative for fever.  HENT: Positive for ear pain.   Musculoskeletal: Positive for neck pain.   Physical Exam Triage Vital Signs ED Triage Vitals  Enc Vitals Group     BP 05/12/17 0937 130/81     Pulse Rate 05/12/17 0937 68     Resp 05/12/17 0937 16     Temp 05/12/17 0937 97.7 F (36.5 C)     Temp Source 05/12/17 0937 Oral     SpO2 05/12/17  0937 98 %     Weight 05/12/17 0938 253 lb (114.8 kg)     Height 05/12/17 0938 5\' 11"  (1.803 m)     Head Circumference --      Peak Flow --      Pain Score 05/12/17 0938 0     Pain Loc --      Pain Edu? --      Excl. in [place]? --    Updated Vital Signs BP 130/81 (BP Location: Left Arm)   Pulse 68   Temp 97.7 F (36.5 C) (Oral)   Resp 16   Ht 5\' 11"  (1.803 m)   Wt 253 lb (114.8 kg)   SpO2 98%   BMI 35.29 kg/m     Physical Exam  Constitutional: He is oriented to person, place, and time. He appears well-developed and well-nourished. No distress.  HENT:  Head: Normocephalic and atraumatic.  Mouth/Throat: Oropharynx is clear and moist.  TMs without erythema bilaterally.  Scarring noted bilaterally.  Eyes: Conjunctivae are normal.  Neck: Neck supple.  Anterior  cervical lymphadenopathy.  Cardiovascular: Normal rate and regular rhythm.  No murmur heard. Pulmonary/Chest: Effort normal and breath sounds normal. No respiratory distress. He has no wheezes. He has no rales.  Neurological: He is alert and oriented to person, place, and time.  Psychiatric: He has a normal mood and affect. His behavior is normal.  Vitals reviewed.  UC Treatments / Results  Labs (all labs ordered are listed, but only abnormal results are displayed) Labs Reviewed  RAPID STREP SCREEN (NOT AT [facility])  CULTURE, GROUP A STREP [facility])    EKG  EKG Interpretation None       Radiology No results found.  Procedures Procedures (including critical care time)  Medications Ordered in UC Medications - No data to display   Initial Impression / Assessment and Plan / UC Course  I have reviewed the triage vital signs and the nursing notes.  Pertinent labs & imaging results that were available during my care of the patient were reviewed by me and considered in my medical decision making (see chart for details).    41 year old male presents with neck pain.  This is likely from lymphadenopathy.  Possible component of postnasal drip.  Self-limited illness.  Supportive care. Final Clinical Impressions(s) / UC Diagnoses   Final diagnoses:  Cervical lymphadenopathy    ED Discharge Orders    None     Controlled Substance Prescriptions [place] Controlled Substance Registry consulted? Not Applicable   Coral Spikes, DO 05/12/17 1006

## 2017-05-12 NOTE — ED Triage Notes (Signed)
Patient started having symptoms of right side sore throat and right ear pain 1 week ago.

## 2017-05-15 LAB — CULTURE, GROUP A STREP (THRC)

## 2017-10-16 ENCOUNTER — Encounter: Payer: Self-pay | Admitting: Internal Medicine

## 2017-10-27 ENCOUNTER — Ambulatory Visit: Payer: BC Managed Care – PPO | Admitting: Internal Medicine

## 2017-10-30 ENCOUNTER — Encounter: Payer: Self-pay | Admitting: Family Medicine

## 2017-10-30 ENCOUNTER — Ambulatory Visit: Payer: BC Managed Care – PPO | Admitting: Family Medicine

## 2017-10-30 VITALS — BP 110/74 | HR 76 | Temp 98.7°F | Wt 252.1 lb

## 2017-10-30 DIAGNOSIS — R21 Rash and other nonspecific skin eruption: Secondary | ICD-10-CM | POA: Diagnosis not present

## 2017-10-30 MED ORDER — ERYTHROMYCIN 2 % EX GEL: Freq: Two times a day (BID) | CUTANEOUS | 0 refills | Status: DC

## 2017-10-30 NOTE — Progress Notes (Signed)
Patient ID: Victor Craig, male   DOB: [birth year], 42 y.o.   MRN: [id number]   PCP: Einar Pheasant, MD  Subjective:  Victor Craig is a 42 y.o. year old very pleasant male patient who presents with a Rash: Initial distribution: Around eyes bilaterally Prior history of this rash: Yes Discomfort associated: mild burning intermittently Associated symptoms:   Mild pruritus Change in rash:  No Denies:  Fever, chills, sweats, myalgias, difficulty swallowing, hoarseness, SOB, N/V, Abdominal pain, or tightening throat. No new exposures of soaps, lotions, laundry detergent, fabric softeners, foods, medications, herbal supplements, STDs, plants, animals, or insects.  -started: approximately 5 to 6 weeks ago , symptoms occur approximately 5 to 6 days a week; may improve one day a week -previous treatments: hydrocortisone that was provided by dermatology; Allegra and Zyrtec for symptoms of allergies that was also recommended by dermatology has provided moderate benefit. -sick contacts/travel/risks: denies flu exposure. No recent sick contact exposure -Hx of: seasonal allergies  ROS-denies fever, SOB, NVD,   Pertinent Past Medical History- GERD, seasonal allergies  Medications- reviewed  Current Outpatient Medications  Medication Sig Dispense Refill  . hydrocortisone 2.5 % ointment APPLY TO AFFECTED AREA TWICE A DAY UNTIL SKIN IS CLEAR THEN STOP AND ONLY USE AS NEEDED  4  . RABEprazole (ACIPHEX) 20 MG tablet TAKE 1 TABLET (20 MG TOTAL) BY MOUTH 2 (TWO) TIMES DAILY BEFORE A MEAL. 180 tablet 1   No current facility-administered medications for this visit.     Objective: BP 110/74 (BP Location: Left Arm, Patient Position: Sitting, Cuff Size: Large)   Pulse 76   Temp 98.7 F (37.1 C) (Oral)   Wt 252 lb 2 oz (114.4 kg)   SpO2 98%   BMI 35.16 kg/m  Gen: NAD, resting comfortably HEENT: Oropharynx is clear and moist CV: RRR no murmurs rubs or gallops Lungs: CTAB no crackles, wheeze,  rhonchi Abdomen: soft/nontender/nondistended/normal bowel sounds. No rebound or guarding.  Ext: no edema Skin: warm, dry, mild erythema noted on upper eyelids bilaterally. No lesions present today Neuro: grossly normal, moves all extremities  Assessment/Plan:  Rash Patient reports that rash has improved today and only mild erythema is present. He also states that this can improve approximately one day a week. Challenging to determine etiology based upon exam however with report of erythema and lesions that are described as papules can consider periorificial dermatitis vs. Dermatitis, or symptoms related to allergies as previously treated for by dermatology. Will provide e-mycin gel to be used sparingly around eyes but not in contact with eyes. Further advised that he should treat symptoms of allergies as directed by dermatology and take either Allegra, Claritin, or Zyrtec. He will use hydrocortisone if needed for symptom relief. Recommended follow up with dermatology to determine etiology as this empiric option is based upon symptoms as rash has improved.   - erythromycin with ethanol (EMGEL) 2 % gel; Apply topically 2 (two) times daily.  Dispense: 30 g; Refill: 0   Finally, we reviewed reasons to return to care including if symptoms worsen or persist or new concerns arise- once again particularly rash that does not improve,  shortness of breath, N/V, or fever. Further advised patient to keep a diary of food choices and also use dye/scent free products.  Reviewed importance of calling 911immediately if symptoms of SOB, difficulty swallowing, or throat tightening.   Laurita Quint, FNP

## 2017-10-30 NOTE — Patient Instructions (Signed)
Please monitor for symptoms and use gel in a thin layer twice daily.  If symptoms do not improve with treatment, worsen, or new symptoms develop, recommend follow up with dermatology.  Change wash cloth daily.  Advise either Allegra, Claritin, or Zyrtec once daily.  You can use hydrocortisone if needed that was prescribed by dermatology sparingly.     Rash A rash is a change in the color of the skin. A rash can also change the way your skin feels. There are many different conditions and factors that can cause a rash. Follow these instructions at home: Pay attention to any changes in your symptoms. Follow these instructions to help with your condition: Medicine Take or apply over-the-counter and prescription medicines only as told by your doctor. These may include:  Corticosteroid cream.  Anti-itch lotions.  Oral antihistamines.  Skin Care  Put cool compresses on the affected areas.  Try taking a bath with: ? Epsom salts. Follow the instructions on the packaging. You can get these at your local pharmacy or grocery store. ? Baking soda. Pour a small amount into the bath as told by your doctor. ? Colloidal oatmeal. Follow the instructions on the packaging. You can get this at your local pharmacy or grocery store.  Try putting baking soda paste onto your skin. Stir water into baking soda until it gets like a paste.  Do not scratch or rub your skin.  Avoid covering the rash. Make sure the rash is exposed to air as much as possible. General instructions  Avoid hot showers or baths, which can make itching worse. A cold shower may help.  Avoid scented soaps, detergents, and perfumes. Use gentle soaps, detergents, perfumes, and other cosmetic products.  Avoid anything that causes your rash. Keep a journal to help track what causes your rash. Write down: ? What you eat. ? What cosmetic products you use. ? What you drink. ? What you wear. This includes jewelry.  Keep all  follow-up visits as told by your doctor. This is important. Contact a doctor if:  You sweat at night.  You lose weight.  You pee (urinate) more than normal.  You feel weak.  You throw up (vomit).  Your skin or the whites of your eyes look yellow (jaundice).  Your skin: ? Tingles. ? Is numb.  Your rash: ? Does not go away after a few days. ? Gets worse.  You are: ? More thirsty than normal. ? More tired than normal.  You have: ? New symptoms. ? Pain in your belly (abdomen). ? A fever. ? Watery poop (diarrhea). Get help right away if:  Your rash covers all or most of your body. The rash may or may not be painful.  You have blisters that: ? Are on top of the rash. ? Grow larger. ? Grow together. ? Are painful. ? Are inside your nose or mouth.  You have a rash that: ? Looks like purple pinprick-sized spots all over your body. ? Has a "bull's eye" or looks like a target. ? Is red and painful, causes your skin to peel, and is not from being in the sun too long. This information is not intended to replace advice given to you by your health care provider. Make sure you discuss any questions you have with your health care provider. Document Released: 10/23/2007 Document Revised: 10/12/2015 Document Reviewed: 09/21/2014 Elsevier Interactive Patient Education  2018 [employer].

## 2017-10-31 ENCOUNTER — Telehealth: Payer: Self-pay | Admitting: Family Medicine

## 2017-10-31 ENCOUNTER — Ambulatory Visit: Payer: Self-pay

## 2017-10-31 NOTE — Telephone Encounter (Signed)
Advise patient to use gel sparingly and do not get in contact with eyes. As rash was improved yesterday, we are trying this option to note if improvement occurs. If area becomes irritated, stop using gel.   Recommend follow up with dermatology to determine source if no improvement.

## 2017-10-31 NOTE — Telephone Encounter (Signed)
Telephone call from client  Reviewed  instructions with client provided  By Delano Metz, [place]. Recommended client treat allergies with either Allergra, Claritin or Zyrtec.  Instructed to f/u with dermatology. Client voiced under standing.

## 2017-10-31 NOTE — Telephone Encounter (Signed)
Gel is to be used on skin and not in contact with eyes. Advise treating allergies with either Allegra, Claritin, or Zyrtec and he should follow up with dermatology since this has been evaluated by them previously and rash had improved when I saw him yesterday.

## 2017-10-31 NOTE — Telephone Encounter (Signed)
Left voice mail for patient to call back ok for [facility] to speak to patient    

## 2017-10-31 NOTE — Telephone Encounter (Signed)
Spoke with patient he states he is unable to use gel without getting in his eyes.  He wants to know what else he can do?  Please advise Advised him he needs to follow up with dermatology. Marland Kitchen

## 2017-11-05 NOTE — Telephone Encounter (Signed)
Patient advised of below and he will follow up with dermatology , he hasn't contacted dermatology.   He will try different antihistamine, he has been using allegra will try claritin or zyrtec.

## 2017-11-30 ENCOUNTER — Other Ambulatory Visit: Payer: Self-pay | Admitting: Family Medicine

## 2017-11-30 DIAGNOSIS — R21 Rash and other nonspecific skin eruption: Secondary | ICD-10-CM

## 2018-02-10 ENCOUNTER — Ambulatory Visit: Payer: BC Managed Care – PPO | Admitting: Internal Medicine

## 2018-04-01 ENCOUNTER — Other Ambulatory Visit: Payer: Self-pay | Admitting: Internal Medicine

## 2018-04-01 NOTE — Telephone Encounter (Signed)
I refilled his medication, but he needs to be scheduled for a physical.  Last 04/16/18.  Overdue a f/u.

## 2018-04-23 NOTE — Telephone Encounter (Signed)
Left message for pt to call back and schedule

## 2018-05-22 ENCOUNTER — Encounter: Payer: Self-pay | Admitting: Internal Medicine

## 2018-05-22 NOTE — Telephone Encounter (Signed)
Patient stated that he is feeling a lot better than he was yesterday and some better than he was this morning. He would like to see if he starts feeling better over the weekend and will call back if symptoms do not resolve. Advised he should be evaluated if symptoms worsen or become persistent

## 2018-05-22 NOTE — Telephone Encounter (Signed)
If having increased ear pain, then I would recommend evaluation.  Unable to know how to treat or what treating without seeing.

## 2018-05-22 NOTE — Telephone Encounter (Signed)
Is there something OTC that you recommend or would you like for me to suggest an urgent care?

## 2018-05-23 ENCOUNTER — Ambulatory Visit
Admission: EM | Admit: 2018-05-23 | Discharge: 2018-05-23 | Disposition: A | Discharge status: Home / Self Care | Payer: BC Managed Care – PPO | Attending: Emergency Medicine | Admitting: Emergency Medicine

## 2018-05-23 ENCOUNTER — Other Ambulatory Visit: Payer: Self-pay

## 2018-05-23 DIAGNOSIS — J0100 Acute maxillary sinusitis, unspecified: Secondary | ICD-10-CM | POA: Diagnosis not present

## 2018-05-23 MED ORDER — PSEUDOEPH-BROMPHEN-DM 30-2-10 MG/5ML PO SYRP: 5.0000 mL | ORAL_SOLUTION | Freq: Four times a day (QID) | ORAL | 0 refills | Status: DC | PRN

## 2018-05-23 MED ORDER — FLUTICASONE PROPIONATE 50 MCG/ACT NA SUSP: 2.0000 | Freq: Every day | NASAL | 0 refills | Status: DC

## 2018-05-23 MED ORDER — AMOXICILLIN-POT CLAVULANATE 875-125 MG PO TABS: 1.0000 | ORAL_TABLET | Freq: Two times a day (BID) | ORAL | 0 refills | Status: DC

## 2018-05-23 MED ORDER — BENZONATATE 200 MG PO CAPS: ORAL_CAPSULE | ORAL | 0 refills | Status: DC

## 2018-05-23 NOTE — ED Triage Notes (Signed)
Patient complains of cough, congestion, sinus pain and pressure, headache and bilateral ear pressure x 2 weeks.

## 2018-06-24 ENCOUNTER — Ambulatory Visit: Payer: BC Managed Care – PPO | Admitting: Family Medicine

## 2018-06-24 ENCOUNTER — Encounter: Payer: Self-pay | Admitting: Family Medicine

## 2018-06-24 VITALS — BP 120/78 | HR 75 | Temp 98.2°F | Resp 18 | Ht 71.0 in | Wt 248.6 lb

## 2018-06-24 DIAGNOSIS — R197 Diarrhea, unspecified: Secondary | ICD-10-CM | POA: Diagnosis not present

## 2018-06-24 DIAGNOSIS — R05 Cough: Secondary | ICD-10-CM

## 2018-06-24 DIAGNOSIS — R059 Cough, unspecified: Secondary | ICD-10-CM

## 2018-06-24 MED ORDER — HYDROCOD POLST-CPM POLST ER 10-8 MG/5ML PO SUER: 5.0000 mL | Freq: Two times a day (BID) | ORAL | 0 refills | Status: DC | PRN

## 2018-06-24 NOTE — Patient Instructions (Signed)
Follow a bland liquid food diet for the next 24 to 48 hours.  If needed you can use Imodium to help improve diarrhea symptoms tomorrow morning before going back to work.  Imodium is available over-the-counter.   Bland Diet A bland diet consists of foods that are often soft and do not have a lot of fat, fiber, or extra seasonings. Foods without fat, fiber, or seasoning are easier for the body to digest. They are also less likely to irritate your mouth, throat, stomach, and other parts of your digestive system. A bland diet is sometimes called a BRAT diet. What is my plan? Your health care provider or food and nutrition specialist (dietitian) may recommend specific changes to your diet to prevent symptoms or to treat your symptoms. These changes may include:  Eating small meals often.  Cooking food until it is soft enough to chew easily.  Chewing your food well.  Drinking fluids slowly.  Not eating foods that are very spicy, sour, or fatty.  Not eating citrus fruits, such as oranges and grapefruit. What do I need to know about this diet?  Eat a variety of foods from the bland diet food list.  Do not follow a bland diet longer than needed.  Ask your health care provider whether you should take vitamins or supplements. What foods can I eat? Grains  Hot cereals, such as cream of wheat. Rice. Bread, crackers, or tortillas made from refined white flour. Vegetables Canned or cooked vegetables. Mashed or boiled potatoes. Fruits  Bananas. Applesauce. Other types of cooked or canned fruit with the skin and seeds removed, such as canned peaches or pears. Meats and other proteins  Scrambled eggs. Creamy peanut butter or other nut butters. Lean, well-cooked meats, such as chicken or fish. Tofu. Soups or broths. Dairy Low-fat dairy products, such as milk, cottage cheese, or yogurt. Beverages  Water. Herbal tea. Apple juice. Fats and oils Mild salad dressings. Canola or olive oil. Sweets  and desserts Pudding. Custard. Fruit gelatin. Ice cream. The items listed above may not be a complete list of recommended foods and beverages. Contact a dietitian for more options. What foods are not recommended? Grains Whole grain breads and cereals. Vegetables Raw vegetables. Fruits Raw fruits, especially citrus, berries, or dried fruits. Dairy Whole fat dairy foods. Beverages Caffeinated drinks. Alcohol. Seasonings and condiments Strongly flavored seasonings or condiments. Hot sauce. Salsa. Other foods Spicy foods. Fried foods. Sour foods, such as pickled or fermented foods. Foods with high sugar content. Foods high in fiber. The items listed above may not be a complete list of foods and beverages to avoid. Contact a dietitian for more information. Summary  A bland diet consists of foods that are often soft and do not have a lot of fat, fiber, or extra seasonings.  Foods without fat, fiber, or seasoning are easier for the body to digest.  Check with your health care provider to see how long you should follow this diet plan. It is not meant to be followed for long periods. This information is not intended to replace advice given to you by your health care provider. Make sure you discuss any questions you have with your health care provider. Document Released: 08/28/2015 Document Revised: 06/04/2017 Document Reviewed: 06/04/2017 Elsevier Interactive Patient Education  2019 [employer].

## 2018-06-24 NOTE — Progress Notes (Signed)
Subjective:    Patient ID: Victor Craig, male    DOB: [birth year], 43 y.o.   MRN: [id number]  HPI   Patient presents to clinic complaining of cough and diarrhea.  Patient symptoms began last Wednesday with a high fever and deep cough.  Patient states he also had body aches for the next 3 to 4 days.  Over the weekend patient began to feel improved and back to his normal self.  Cough continues to persist, mainly bothers him at night now and keeps him awake.  Denies any phlegm production with cough.  Denies wheezing or shortness of breath.  Patient is not having any more fever or chills.  Patient states he had teriyaki chicken and rice last night for dinner.  On his chicken.  About an hour later began to have diarrhea.  Patient states he had 3 or 4 episodes of diarrhea during the night last night, and one episode this morning prior to coming to office.  States his wife informed him that that teriyaki was from [employer], and was over a month old and just had been sitting in the fridge.  Patient also reports his daughter was diagnosed with influenza on Sunday, 06/21/2018.  Per patient, the doctor told his wife that his symptoms most likely sound like the flu, and that he believes his daughter got the flu from him.  Patient Active Problem List   Diagnosis Date Noted  . Health care maintenance 04/18/2017  . Back pain 04/15/2017  . Stress 03/17/2016  . History of migraine headaches 01/09/2016  . History of colonic polyps 07/31/2009  . ABDOMINAL PAIN-MULTIPLE SITES 06/21/2009  . GERD 10/11/2008   Social History   Tobacco Use  . Smoking status: Never Smoker  . Smokeless tobacco: Never Used  Substance Use Topics  . Alcohol use: No    Review of Systems  Constitutional: Negative for chills, fatigue and fever.  HENT: Negative for congestion, ear pain, sinus pain and sore throat.   Eyes: Negative.   Respiratory: Negative for shortness of breath and wheezing.  +cough Cardiovascular:  Negative for chest pain, palpitations and leg swelling.  Gastrointestinal: Negative for abdominal pain, nausea and vomiting. +diarrhea Genitourinary: Negative for dysuria, frequency and urgency.  Musculoskeletal: Negative for arthralgias and myalgias.  Skin: Negative for color change, pallor and rash.  Neurological: Negative for syncope, light-headedness and headaches.  Psychiatric/Behavioral: The patient is not nervous/anxious.       Objective:   Physical Exam  Constitutional: He appears well-developed and well-nourished. No distress.  HENT:  Head: Normocephalic and atraumatic.  Eyes: Conjunctivae and EOM are normal. No scleral icterus.  Neck: Normal range of motion. Neck supple. No tracheal deviation present.  Cardiovascular: Normal rate, regular rhythm and normal heart sounds.  Pulmonary/Chest: Effort normal and breath sounds normal. No respiratory distress. He has no wheezes. He has no rales.  Abdominal: Soft. Bowel sounds are normal. There is no tenderness. No guarding or rebound.   Neurological: He is alert and oriented to person, place, and time.  Gait normal  Skin: Skin is warm and dry. He is not diaphoretic. No pallor.  Psychiatric: He has a normal mood and affect. His behavior is normal.    Nursing note and vitals reviewed.    Vitals:   06/24/18 1132  BP: 120/78  Pulse: 75  Resp: 18  Temp: 98.2 F (36.8 C)  SpO2: 98%      Assessment & Plan:   Cough - patient's lungs are clear  on exam.  Suspect cough is lingering from viral illness that he had last week, suspected to be influenza.  Patient will use over-the-counter Mucinex as needed to calm cough during the day and prescribed Tussionex syrup to reduce cough at nighttime to allow him to get some rest.  Advised that Tussionex does cause drowsiness so he should not take this prior to driving.  Diarrhea - due to timing of diarrhea after eating the teriyaki chicken with Ginger dressing, I do suspect it was related to  the food he ate.  Patient advised to drink clear liquids and eat bland foods for the next 24 to 48 hours.  Advised to use Imodium tomorrow morning if needed to help reduce diarrhea symptoms.   Advised patient that his symptoms should slowly begin to improve over the next few days.  If symptoms do not improve as expected or worsen advised to call office right away.

## 2018-06-25 ENCOUNTER — Ambulatory Visit: Payer: BC Managed Care – PPO | Admitting: Family Medicine

## 2018-06-26 ENCOUNTER — Other Ambulatory Visit: Payer: Self-pay | Admitting: Internal Medicine

## 2018-09-23 ENCOUNTER — Other Ambulatory Visit: Payer: Self-pay | Admitting: Internal Medicine

## 2018-09-24 ENCOUNTER — Other Ambulatory Visit: Payer: Self-pay | Admitting: Internal Medicine

## 2018-09-29 ENCOUNTER — Encounter: Payer: Self-pay | Admitting: Internal Medicine

## 2018-09-29 ENCOUNTER — Other Ambulatory Visit: Payer: Self-pay

## 2018-09-29 ENCOUNTER — Ambulatory Visit (INDEPENDENT_AMBULATORY_CARE_PROVIDER_SITE_OTHER): Payer: BC Managed Care – PPO | Admitting: Internal Medicine

## 2018-09-29 DIAGNOSIS — Z9109 Other allergy status, other than to drugs and biological substances: Secondary | ICD-10-CM | POA: Diagnosis not present

## 2018-09-29 DIAGNOSIS — Z125 Encounter for screening for malignant neoplasm of prostate: Secondary | ICD-10-CM

## 2018-09-29 DIAGNOSIS — F439 Reaction to severe stress, unspecified: Secondary | ICD-10-CM

## 2018-09-29 DIAGNOSIS — Z13220 Encounter for screening for lipoid disorders: Secondary | ICD-10-CM

## 2018-09-29 DIAGNOSIS — K219 Gastro-esophageal reflux disease without esophagitis: Secondary | ICD-10-CM

## 2018-09-29 NOTE — Progress Notes (Signed)
Patient ID: Victor Craig, male   DOB: 16-Jan-1976, 43 y.o.   MRN: [id number]   Virtual Visit via video Note  This visit type was conducted due to national recommendations for restrictions regarding the COVID-19 pandemic (e.g. social distancing).  This format is felt to be most appropriate for this patient at this time.  All issues noted in this document were discussed and addressed.  No physical exam was performed (except for noted visual exam findings with Video Visits).   I connected with Jacki Cones" Carrizales by a video enabled telemedicine application and verified that I am speaking with the correct person using two identifiers. Location patient: home Location provider: work Persons participating in the virtual visit: patient, provider  I discussed the limitations, risks, security and privacy concerns of performing an evaluation and management service by video and the availability of in person appointments. The patient expressed understanding and agreed to proceed.   Reason for visit: scheduled follow up.  Overdue.   HPI: He reports he is doing relatively well.  Using flonase to help with allergy symptoms.  States if he uses this regularly, controls his symptoms.  Trying to stay in due to COVID restrictions.  No fever. No sinus pressure.  No chest congestion or cough.  No sob.  No chest pain.  No acid reflux.  No abdominal pain.  Bowels moving.  Taking aciphex and this controls her reflux symptoms.  States if she takes her medication and watches what he eats, symptoms controlled.  Overdue labs.  Handling stress.     ROS: See pertinent positives and negatives per HPI.  Past Medical History:  Diagnosis Date  . GERD (gastroesophageal reflux disease)   . Hiatal hernia   . Hx of migraines   . IBS (irritable bowel syndrome)   . Pancreatitis   . Serrated polyp of colon 06/2014  . Triceps tendon rupture    right    Past Surgical History:  Procedure Laterality Date  . LAPAROSCOPIC  CHOLECYSTECTOMY    . SPHINCTEROTOMY    . TONSILLECTOMY    . TRICEPS TENDON REPAIR Right 04/27/2015   Procedure: RIGHT DISTAL TRICEPS TENDON REPAIR;  Surgeon: [name], MD;  Location: [facility];  Service: Orthopedics;  Laterality: Right;  . tricept  reattached Left   . ULNAR NERVE TRANSPOSITION Right 04/27/2015   Procedure: ULNAR NERVE DECOMPRESSION;  Surgeon: [name], MD;  Location: [facility];  Service: Orthopedics;  Laterality: Right;    Family History  Problem Relation Age of Onset  . Colon polyps Father   . Prostate cancer Paternal Grandfather   . Heart disease Maternal Grandfather   . Hypertension Maternal Grandfather   . Heart disease Mother   . Hypertension Mother   . Colon cancer Neg Hx     SOCIAL HX: reviewed.    Current Outpatient Medications:  .  fluticasone (FLONASE) 50 MCG/ACT nasal spray, Place 2 sprays into both nostrils daily., Disp: 48 g, Rfl: 3 .  RABEprazole (ACIPHEX) 20 MG tablet, TAKE 1 TABLET (20 MG TOTAL) BY MOUTH 2 (TWO) TIMES DAILY BEFORE A MEAL., Disp: 180 tablet, Rfl: 0  EXAM:  GENERAL: alert, oriented, appears well and in no acute distress  HEENT: atraumatic, conjunttiva clear, no obvious abnormalities on inspection of external nose and ears  NECK: normal movements of the head and neck  LUNGS: on inspection no signs of respiratory distress, breathing rate appears normal, no obvious gross SOB, gasping or wheezing  CV:  no obvious cyanosis  PSYCH/NEURO: pleasant and cooperative, no obvious depression or anxiety, speech and thought processing grossly intact  ASSESSMENT AND PLAN:  Discussed the following assessment and plan:  Screening cholesterol level - Plan: Lipid panel  Gastroesophageal reflux disease, esophagitis presence not specified - Plan: CBC with Differential/Platelet, Comprehensive metabolic panel, TSH  Stress  Environmental allergies  Prostate cancer screening - Plan: PSA  GERD  Controlled if he watches what he eats and takes one aciphex per day.  Follow.    Stress Doing well.  Handling stress well.  Follow.    Environmental allergies Doing well on flonase.  Follow.      I discussed the assessment and treatment plan with the patient. The patient was provided an opportunity to ask questions and all were answered. The patient agreed with the plan and demonstrated an understanding of the instructions.   The patient was advised to call back or seek an in-person evaluation if the symptoms worsen or if the condition fails to improve as anticipated.    Einar Pheasant, MD

## 2018-10-01 ENCOUNTER — Encounter: Payer: Self-pay | Admitting: Internal Medicine

## 2018-10-02 MED ORDER — FLUTICASONE PROPIONATE 50 MCG/ACT NA SUSP: 2.0000 | Freq: Every day | NASAL | 3 refills | Status: DC

## 2018-10-02 NOTE — Telephone Encounter (Signed)
rx sent in for flonase 3 months with 3 refills.

## 2018-10-02 NOTE — Telephone Encounter (Signed)
Are you ok with filling this for one year?

## 2018-10-03 ENCOUNTER — Encounter: Payer: Self-pay | Admitting: Internal Medicine

## 2018-10-03 DIAGNOSIS — Z9109 Other allergy status, other than to drugs and biological substances: Secondary | ICD-10-CM | POA: Insufficient documentation

## 2018-10-03 NOTE — Assessment & Plan Note (Signed)
Doing well.  Handling stress well.  Follow.

## 2018-10-03 NOTE — Assessment & Plan Note (Signed)
Controlled if he watches what he eats and takes one aciphex per day.  Follow.

## 2018-10-03 NOTE — Assessment & Plan Note (Signed)
Doing well on flonase.  Follow.

## 2018-10-14 ENCOUNTER — Other Ambulatory Visit: Payer: Self-pay | Admitting: Internal Medicine

## 2018-10-14 ENCOUNTER — Other Ambulatory Visit (INDEPENDENT_AMBULATORY_CARE_PROVIDER_SITE_OTHER): Payer: BC Managed Care – PPO

## 2018-10-14 ENCOUNTER — Other Ambulatory Visit: Payer: Self-pay

## 2018-10-14 DIAGNOSIS — K219 Gastro-esophageal reflux disease without esophagitis: Secondary | ICD-10-CM | POA: Diagnosis not present

## 2018-10-14 DIAGNOSIS — Z125 Encounter for screening for malignant neoplasm of prostate: Secondary | ICD-10-CM

## 2018-10-14 DIAGNOSIS — Z13220 Encounter for screening for lipoid disorders: Secondary | ICD-10-CM | POA: Diagnosis not present

## 2018-10-14 DIAGNOSIS — D72819 Decreased white blood cell count, unspecified: Secondary | ICD-10-CM

## 2018-10-14 LAB — LIPID PANEL
Cholesterol: 174 mg/dL (ref 0–200)
HDL: 37.3 mg/dL — ABNORMAL LOW (ref >39.00)
LDL Cholesterol: 123 mg/dL — ABNORMAL HIGH (ref 0–99)
NonHDL: 136.85
Total CHOL/HDL Ratio: 5
Triglycerides: 69 mg/dL (ref 0.0–149.0)
VLDL: 13.8 mg/dL (ref 0.0–40.0)

## 2018-10-14 LAB — COMPREHENSIVE METABOLIC PANEL
ALT: 40 U/L (ref 0–53)
AST: 25 U/L (ref 0–37)
Albumin: 4.2 g/dL (ref 3.5–5.2)
Alkaline Phosphatase: 23 U/L — ABNORMAL LOW (ref 39–117)
BUN: 17 mg/dL (ref 6–23)
CO2: 29 mEq/L (ref 19–32)
Calcium: 9.5 mg/dL (ref 8.4–10.5)
Chloride: 104 mEq/L (ref 96–112)
Creatinine, Ser: 1.2 mg/dL (ref 0.40–1.50)
GFR: 65.4 mL/min (ref >60.00)
Glucose, Bld: 83 mg/dL (ref 70–99)
Potassium: 4.6 mEq/L (ref 3.5–5.1)
Sodium: 139 mEq/L (ref 135–145)
Total Bilirubin: 0.5 mg/dL (ref 0.2–1.2)
Total Protein: 6.4 g/dL (ref 6.0–8.3)

## 2018-10-14 LAB — CBC WITH DIFFERENTIAL/PLATELET
Basophils Absolute: 0 10*3/uL (ref 0.0–0.1)
Basophils Relative: 0.6 % (ref 0.0–3.0)
Eosinophils Absolute: 0.2 10*3/uL (ref 0.0–0.7)
Eosinophils Relative: 4.6 % (ref 0.0–5.0)
HCT: 43.7 % (ref 39.0–52.0)
Hemoglobin: 15.1 g/dL (ref 13.0–17.0)
Lymphocytes Relative: 32.4 % (ref 12.0–46.0)
Lymphs Abs: 1.1 10*3/uL (ref 0.7–4.0)
MCHC: 34.5 g/dL (ref 30.0–36.0)
MCV: 88.4 fl (ref 78.0–100.0)
Monocytes Absolute: 0.3 10*3/uL (ref 0.1–1.0)
Monocytes Relative: 8.2 % (ref 3.0–12.0)
Neutro Abs: 1.9 10*3/uL (ref 1.4–7.7)
Neutrophils Relative %: 54.2 % (ref 43.0–77.0)
Platelets: 164 10*3/uL (ref 150.0–400.0)
RBC: 4.95 Mil/uL (ref 4.22–5.81)
RDW: 13.3 % (ref 11.5–15.5)
WBC: 3.5 10*3/uL — ABNORMAL LOW (ref 4.0–10.5)

## 2018-10-14 LAB — PSA: PSA: 0.43 ng/mL (ref 0.10–4.00)

## 2018-10-14 LAB — TSH: TSH: 1.96 u[IU]/mL (ref 0.35–4.50)

## 2018-10-14 NOTE — Progress Notes (Signed)
Order placed for f/u cbc.   

## 2018-10-19 ENCOUNTER — Telehealth: Payer: Self-pay

## 2018-10-19 ENCOUNTER — Encounter: Payer: Self-pay | Admitting: Internal Medicine

## 2018-10-19 NOTE — Telephone Encounter (Signed)
Copied from [place] [id number]. Topic: General - Call Back - No Documentation >> Oct 16, 2018  4:04 PM Selinda Flavin B, NT wrote: Reason for CRM: Patient returning a call to the office. Unsure what it was regarding. Attempted to reach office x 2, no answer. Please advise.

## 2018-10-19 NOTE — Telephone Encounter (Signed)
Noted  

## 2018-12-20 ENCOUNTER — Other Ambulatory Visit: Payer: Self-pay | Admitting: Internal Medicine

## 2018-12-21 ENCOUNTER — Other Ambulatory Visit: Payer: BC Managed Care – PPO

## 2018-12-23 ENCOUNTER — Other Ambulatory Visit: Payer: Self-pay

## 2018-12-23 ENCOUNTER — Other Ambulatory Visit (INDEPENDENT_AMBULATORY_CARE_PROVIDER_SITE_OTHER): Payer: BC Managed Care – PPO

## 2018-12-23 DIAGNOSIS — D72819 Decreased white blood cell count, unspecified: Secondary | ICD-10-CM

## 2018-12-23 LAB — CBC WITH DIFFERENTIAL/PLATELET
Basophils Absolute: 0 10*3/uL (ref 0.0–0.1)
Basophils Relative: 0.7 % (ref 0.0–3.0)
Eosinophils Absolute: 0.3 10*3/uL (ref 0.0–0.7)
Eosinophils Relative: 4.2 % (ref 0.0–5.0)
HCT: 44.2 % (ref 39.0–52.0)
Hemoglobin: 15 g/dL (ref 13.0–17.0)
Lymphocytes Relative: 24.5 % (ref 12.0–46.0)
Lymphs Abs: 1.5 10*3/uL (ref 0.7–4.0)
MCHC: 34 g/dL (ref 30.0–36.0)
MCV: 89.7 fl (ref 78.0–100.0)
Monocytes Absolute: 0.4 10*3/uL (ref 0.1–1.0)
Monocytes Relative: 6.9 % (ref 3.0–12.0)
Neutro Abs: 3.9 10*3/uL (ref 1.4–7.7)
Neutrophils Relative %: 63.7 % (ref 43.0–77.0)
Platelets: 176 10*3/uL (ref 150.0–400.0)
RBC: 4.92 Mil/uL (ref 4.22–5.81)
RDW: 13.1 % (ref 11.5–15.5)
WBC: 6.1 10*3/uL (ref 4.0–10.5)

## 2018-12-24 ENCOUNTER — Encounter: Payer: Self-pay | Admitting: Internal Medicine

## 2019-02-11 ENCOUNTER — Other Ambulatory Visit: Payer: Self-pay

## 2019-02-15 ENCOUNTER — Other Ambulatory Visit: Payer: Self-pay

## 2019-02-15 ENCOUNTER — Ambulatory Visit (INDEPENDENT_AMBULATORY_CARE_PROVIDER_SITE_OTHER): Payer: BC Managed Care – PPO | Admitting: Internal Medicine

## 2019-02-15 ENCOUNTER — Encounter: Payer: Self-pay | Admitting: Internal Medicine

## 2019-02-15 VITALS — BP 118/68 | HR 68 | Temp 97.7°F | Resp 16 | Ht 71.0 in | Wt 251.0 lb

## 2019-02-15 DIAGNOSIS — Z13220 Encounter for screening for lipoid disorders: Secondary | ICD-10-CM | POA: Diagnosis not present

## 2019-02-15 DIAGNOSIS — Z0000 Encounter for general adult medical examination without abnormal findings: Secondary | ICD-10-CM | POA: Diagnosis not present

## 2019-02-15 DIAGNOSIS — Z23 Encounter for immunization: Secondary | ICD-10-CM

## 2019-02-15 DIAGNOSIS — K219 Gastro-esophageal reflux disease without esophagitis: Secondary | ICD-10-CM | POA: Diagnosis not present

## 2019-02-15 DIAGNOSIS — Z9109 Other allergy status, other than to drugs and biological substances: Secondary | ICD-10-CM

## 2019-02-15 NOTE — Progress Notes (Signed)
Patient ID: Victor Craig, male   DOB: [birth year], 43 y.o.   MRN: [id number]   Subjective:    Patient ID: Victor Craig, male    DOB: 1975-08-16, 43 y.o.   MRN: [id number]  HPI  Patient here for his physical exam.  He reports he is doing well.  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  No chest pain.  No sob.  No acid reflux.  Controled with aciphex..  No abdominal pain.  Bowels moving.  Handling stress.    Past Medical History:  Diagnosis Date  . GERD (gastroesophageal reflux disease)   . Hiatal hernia   . Hx of migraines   . IBS (irritable bowel syndrome)   . Pancreatitis   . Serrated polyp of colon 06/2014  . Triceps tendon rupture    right   Past Surgical History:  Procedure Laterality Date  . LAPAROSCOPIC CHOLECYSTECTOMY    . SPHINCTEROTOMY    . TONSILLECTOMY    . TRICEPS TENDON REPAIR Right 04/27/2015   Procedure: RIGHT DISTAL TRICEPS TENDON REPAIR;  Surgeon: [name], MD;  Location: [facility];  Service: Orthopedics;  Laterality: Right;  . tricept  reattached Left   . ULNAR NERVE TRANSPOSITION Right 04/27/2015   Procedure: ULNAR NERVE DECOMPRESSION;  Surgeon: [name], MD;  Location: [facility];  Service: Orthopedics;  Laterality: Right;   Family History  Problem Relation Age of Onset  . Colon polyps Father   . Prostate cancer Paternal Grandfather   . Heart disease Maternal Grandfather   . Hypertension Maternal Grandfather   . Heart disease Mother   . Hypertension Mother   . Colon cancer Neg Hx    Social History   Socioeconomic History  . Marital status: Married    Spouse name: Not on file  . Number of children: 2  . Years of education: Not on file  . Highest education level: Not on file  Occupational History  . Occupation: Product manager: [employer]  Social Needs  . Financial resource strain: Not on file  . Food insecurity    Worry: Not on file    Inability: Not on file  .  Transportation needs    Medical: Not on file    Non-medical: Not on file  Tobacco Use  . Smoking status: Never Smoker  . Smokeless tobacco: Never Used  Substance and Sexual Activity  . Alcohol use: No  . Drug use: No  . Sexual activity: Not on file  Lifestyle  . Physical activity    Days per week: Not on file    Minutes per session: Not on file  . Stress: Not on file  Relationships  . Social Herbalist on phone: Not on file    Gets together: Not on file    Attends religious service: Not on file    Active member of club or organization: Not on file    Attends meetings of clubs or organizations: Not on file    Relationship status: Not on file  Other Topics Concern  . Not on file  Social History Narrative  . Not on file    Outpatient Encounter Medications as of 02/15/2019  Medication Sig  . fluticasone (FLONASE) 50 MCG/ACT nasal spray Place 2 sprays into both nostrils daily.  . RABEprazole (ACIPHEX) 20 MG tablet TAKE 1 TABLET (20 MG TOTAL) BY MOUTH 2 (TWO) TIMES DAILY BEFORE A MEAL.  No facility-administered encounter medications on file as of 02/15/2019.     Review of Systems  Constitutional: Negative for appetite change and unexpected weight change.  HENT: Negative for congestion and sinus pressure.   Eyes: Negative for pain and visual disturbance.  Respiratory: Negative for cough, chest tightness and shortness of breath.   Cardiovascular: Negative for chest pain, palpitations and leg swelling.  Gastrointestinal: Negative for abdominal pain, diarrhea, nausea and vomiting.  Genitourinary: Negative for difficulty urinating and dysuria.  Musculoskeletal: Negative for joint swelling and myalgias.  Skin: Negative for color change and rash.  Neurological: Negative for dizziness, light-headedness and headaches.  Hematological: Negative for adenopathy. Does not bruise/bleed easily.  Psychiatric/Behavioral: Negative for agitation and dysphoric mood.        Objective:    Physical Exam Constitutional:      General: He is not in acute distress.    Appearance: Normal appearance. He is well-developed.  HENT:     Right Ear: External ear normal.     Left Ear: External ear normal.     Mouth/Throat:     Pharynx: No oropharyngeal exudate.  Eyes:     General: No scleral icterus.       Right eye: No discharge.        Left eye: No discharge.     Conjunctiva/sclera: Conjunctivae normal.  Neck:     Musculoskeletal: Neck supple. No muscular tenderness.     Thyroid: No thyromegaly.  Cardiovascular:     Rate and Rhythm: Normal rate and regular rhythm.  Pulmonary:     Effort: No respiratory distress.     Breath sounds: Normal breath sounds. No wheezing.  Abdominal:     General: Bowel sounds are normal.     Palpations: Abdomen is soft.     Tenderness: There is no abdominal tenderness.  Genitourinary:    Comments: Not performed.  Musculoskeletal:        General: No swelling or tenderness.  Lymphadenopathy:     Cervical: No cervical adenopathy.  Skin:    Findings: No erythema or rash.  Neurological:     Mental Status: He is alert and oriented to person, place, and time.  Psychiatric:        Mood and Affect: Mood normal.        Behavior: Behavior normal.     BP 118/68   Pulse 68   Temp 97.7 F (36.5 C)   Resp 16   Ht 5\' 11"  (1.803 m)   Wt 251 lb (113.9 kg)   SpO2 97%   BMI 35.01 kg/m  Wt Readings from Last 3 Encounters:  02/15/19 251 lb (113.9 kg)  06/24/18 248 lb 9.6 oz (112.8 kg)  05/23/18 250 lb (113.4 kg)     Lab Results  Component Value Date   WBC 6.1 12/23/2018   HGB 15.0 12/23/2018   HCT 44.2 12/23/2018   PLT 176.0 12/23/2018   GLUCOSE 83 10/14/2018   CHOL 174 10/14/2018   TRIG 69.0 10/14/2018   HDL 37.30 (L) 10/14/2018   LDLCALC 123 (H) 10/14/2018   ALT 40 10/14/2018   AST 25 10/14/2018   NA 139 10/14/2018   K 4.6 10/14/2018   CL 104 10/14/2018   CREATININE 1.20 10/14/2018   BUN 17 10/14/2018   CO2 29  10/14/2018   TSH 1.96 10/14/2018   PSA 0.43 10/14/2018   INR 0.9 03/15/2012       Assessment & Plan:   Problem List Items Addressed This Visit  Environmental allergies    Controlled.  Doing well on flonase.        GERD    Controlled if he watches what he eats and takes aciphex.  Follow.        Relevant Orders   Lipid panel   Health care maintenance    Physical today 02/15/19.  Colonoscopy 2016.  Recommended f/u colonoscopy in 2021.  PSA 10/14/18 - .43.         Other Visit Diagnoses    Screening cholesterol level    -  Primary   Relevant Orders   Comprehensive metabolic panel   Need for immunization against influenza       Relevant Orders   Flu Vaccine QUAD 36+ mos IM (Completed)       Einar Pheasant, MD

## 2019-02-21 ENCOUNTER — Encounter: Payer: Self-pay | Admitting: Internal Medicine

## 2019-02-21 NOTE — Assessment & Plan Note (Signed)
Controlled if he watches what he eats and takes aciphex.  Follow.

## 2019-02-21 NOTE — Assessment & Plan Note (Signed)
Physical today 02/15/19.  Colonoscopy 2016.  Recommended f/u colonoscopy in 2021.  PSA 10/14/18 - .43.

## 2019-02-21 NOTE — Assessment & Plan Note (Signed)
Controlled.  Doing well on flonase.

## 2019-05-03 ENCOUNTER — Encounter: Payer: Self-pay | Admitting: Internal Medicine

## 2019-07-07 ENCOUNTER — Encounter: Payer: Self-pay | Admitting: Gastroenterology

## 2019-08-16 ENCOUNTER — Encounter: Payer: BC Managed Care – PPO | Admitting: Gastroenterology

## 2019-09-07 ENCOUNTER — Other Ambulatory Visit: Payer: Self-pay | Admitting: Internal Medicine

## 2019-10-26 ENCOUNTER — Other Ambulatory Visit: Payer: Self-pay

## 2019-10-26 ENCOUNTER — Ambulatory Visit (AMBULATORY_SURGERY_CENTER): Payer: Self-pay | Admitting: *Deleted

## 2019-10-26 VITALS — Ht 71.0 in | Wt 249.0 lb

## 2019-10-26 DIAGNOSIS — Z86010 Personal history of colonic polyps: Secondary | ICD-10-CM

## 2019-10-26 MED ORDER — SUTAB 1479-225-188 MG PO TABS: 1.0000 | ORAL_TABLET | ORAL | 0 refills | Status: DC

## 2019-10-26 NOTE — Progress Notes (Signed)
Patient is here in-person for PV. Patient denies any allergies to eggs or soy. Patient denies any problems with anesthesia/sedation. Patient denies any oxygen use at home. Patient denies taking any diet/weight loss medications or blood thinners. Patient is not being treated for MRSA or C-diff. Patient is aware of our care-partner policy and [id number]-[id number] safety protocol. EMMI education assisgned to the patient for the procedure, this was explained and instructions given to patient.   Completed covid vaccines per pt April 2021. Prep Prescription coupon given to the patient.

## 2019-10-28 ENCOUNTER — Encounter: Payer: Self-pay | Admitting: Gastroenterology

## 2019-11-09 ENCOUNTER — Ambulatory Visit (AMBULATORY_SURGERY_CENTER): Payer: BC Managed Care – PPO | Admitting: Gastroenterology

## 2019-11-09 ENCOUNTER — Other Ambulatory Visit: Payer: Self-pay

## 2019-11-09 ENCOUNTER — Encounter: Payer: Self-pay | Admitting: Gastroenterology

## 2019-11-09 VITALS — BP 125/80 | HR 67 | Temp 96.8°F | Resp 12 | Ht 71.0 in | Wt 249.0 lb

## 2019-11-09 DIAGNOSIS — D123 Benign neoplasm of transverse colon: Secondary | ICD-10-CM

## 2019-11-09 DIAGNOSIS — Z86010 Personal history of colonic polyps: Secondary | ICD-10-CM

## 2019-11-09 DIAGNOSIS — K635 Polyp of colon: Secondary | ICD-10-CM | POA: Diagnosis not present

## 2019-11-09 HISTORY — PX: COLONOSCOPY: SHX174

## 2019-11-09 MED ORDER — SODIUM CHLORIDE 0.9 % IV SOLN: 500.0000 mL | Freq: Once | INTRAVENOUS | Status: DC

## 2019-11-09 NOTE — Op Note (Signed)
[facility] Patient Name: Victor Craig Procedure Date: 11/09/2019 7:09 AM MRN: [id number] Endoscopist: Ladene Artist , MD Age: 44 Referring MD:  Date of Birth: June 02, 1975 Gender: Male Account #: [id number] Procedure:                Colonoscopy Indications:              High risk colon cancer surveillance: Personal                            history of sessile serrated colon polyp (less than                            10 mm in size) with no dysplasia Medicines:                Monitored Anesthesia Care Procedure:                Pre-Anesthesia Assessment:                           - Prior to the procedure, a History and Physical                            was performed, and patient medications and                            allergies were reviewed. The patient's tolerance of                            previous anesthesia was also reviewed. The risks                            and benefits of the procedure and the sedation                            options and risks were discussed with the patient.                            All questions were answered, and informed consent                            was obtained. Prior Anticoagulants: The patient has                            taken no previous anticoagulant or antiplatelet                            agents. ASA Grade Assessment: II - A patient with                            mild systemic disease. After reviewing the risks                            and benefits, the patient was deemed in  satisfactory condition to undergo the procedure.                           After obtaining informed consent, the colonoscope                            was passed under direct vision. Throughout the                            procedure, the patient's blood pressure, pulse, and                            oxygen saturations were monitored continuously. The                            Colonoscope was introduced  through the anus and                            advanced to the the cecum, identified by                            appendiceal orifice and ileocecal valve. The                            ileocecal valve, appendiceal orifice, and rectum                            were photographed. The appendiceal orifice photo                            did not capture. The quality of the bowel                            preparation was excellent. The colonoscopy was                            performed without difficulty. The patient tolerated                            the procedure well. Scope In: 8:09:37 AM Scope Out: 8:24:30 AM Scope Withdrawal Time: 0 hours 13 minutes 2 seconds  Total Procedure Duration: 0 hours 14 minutes 53 seconds  Findings:                 The perianal and digital rectal examinations were                            normal.                           A 6 mm polyp was found in the transverse colon. The                            polyp was sessile. The polyp was removed with a  cold biopsy forceps. Resection and retrieval were                            complete.                           Internal hemorrhoids were found during                            retroflexion. The hemorrhoids were small and Grade                            I (internal hemorrhoids that do not prolapse).                           The exam was otherwise without abnormality on                            direct and retroflexion views. Complications:            No immediate complications. Estimated blood loss:                            None. Estimated Blood Loss:     Estimated blood loss: none. Impression:               - One 6 mm polyp in the transverse colon, removed                            with a cold biopsy forceps. Resected and retrieved.                           - Internal hemorrhoids.                           - The examination was otherwise normal on direct                             and retroflexion views. Recommendation:           - Repeat colonoscopy after studies are complete for                            surveillance based on pathology results.                           - Patient has a contact number available for                            emergencies. The signs and symptoms of potential                            delayed complications were discussed with the                            patient. Return to normal activities tomorrow.  Written discharge instructions were provided to the                            patient.                           - Resume previous diet.                           - Continue present medications.                           - Await pathology results. Ladene Artist, MD 11/09/2019 8:28:07 AM This report has been signed electronically.

## 2019-11-09 NOTE — Progress Notes (Signed)
Pt's states no medical or surgical changes since previsit or office visit.  ° °Vitals CW °

## 2019-11-09 NOTE — Progress Notes (Signed)
Called to room to assist during endoscopic procedure.  Patient ID and intended procedure confirmed with present staff. Received instructions for my participation in the procedure from the performing physician.  

## 2019-11-09 NOTE — Patient Instructions (Signed)
Information on polyps and hemorrhoids given to you today.  Await pathology results.  Continue present medications and diet.  YOU HAD AN ENDOSCOPIC PROCEDURE TODAY AT [facility]:   Refer to the procedure report that was given to you for any specific questions about what was found during the examination.  If the procedure report does not answer your questions, please call your gastroenterologist to clarify.  If you requested that your care partner not be given the details of your procedure findings, then the procedure report has been included in a sealed envelope for you to review at your convenience later.  YOU SHOULD EXPECT: Some feelings of bloating in the abdomen. Passage of more gas than usual.  Walking can help get rid of the air that was put into your GI tract during the procedure and reduce the bloating. If you had a lower endoscopy (such as a colonoscopy or flexible sigmoidoscopy) you may notice spotting of blood in your stool or on the toilet paper. If you underwent a bowel prep for your procedure, you may not have a normal bowel movement for a few days.  Please Note:  You might notice some irritation and congestion in your nose or some drainage.  This is from the oxygen used during your procedure.  There is no need for concern and it should clear up in a day or so.  SYMPTOMS TO REPORT IMMEDIATELY:   Following lower endoscopy (colonoscopy or flexible sigmoidoscopy):  Excessive amounts of blood in the stool  Significant tenderness or worsening of abdominal pains  Swelling of the abdomen that is new, acute  Fever of 100F or higher  For urgent or emergent issues, a gastroenterologist can be reached at any hour by calling [phone]. Do not use MyChart messaging for urgent concerns.    DIET:  We do recommend a small meal at first, but then you may proceed to your regular diet.  Drink plenty of fluids but you should avoid alcoholic beverages for 24  hours.  ACTIVITY:  You should plan to take it easy for the rest of today and you should NOT DRIVE or use heavy machinery until tomorrow (because of the sedation medicines used during the test).    FOLLOW UP: Our staff will call the number listed on your records 48-72 hours following your procedure to check on you and address any questions or concerns that you may have regarding the information given to you following your procedure. If we do not reach you, we will leave a message.  We will attempt to reach you two times.  During this call, we will ask if you have developed any symptoms of COVID 19. If you develop any symptoms (ie: fever, flu-like symptoms, shortness of breath, cough etc.) before then, please call [phone].  If you test positive for Covid 19 in the 2 weeks post procedure, please call and report this information to [place].    If any biopsies were taken you will be contacted by phone or by letter within the next 1-3 weeks.  Please call us at [phone] if you have not heard about the biopsies in 3 weeks.    SIGNATURES/CONFIDENTIALITY: You and/or your care partner have signed paperwork which will be entered into your electronic medical record.  These signatures attest to the fact that that the information above on your After Visit Summary has been reviewed and is understood.  Full responsibility of the confidentiality of this discharge information lies with you and/or your  care-partner.

## 2019-11-09 NOTE — Progress Notes (Signed)
pt tolerated well. VSS. awake and to recovery. Report given to RN.  

## 2019-11-11 ENCOUNTER — Telehealth: Payer: Self-pay | Admitting: *Deleted

## 2019-11-11 NOTE — Telephone Encounter (Signed)
No answer or voice mail reached on f/u call

## 2019-11-11 NOTE — Telephone Encounter (Signed)
  Follow up Call-  Call back number 11/09/2019  Post procedure Call Back phone  # [phone]  Permission to leave phone message Yes  Some recent data might be hidden     Patient questions:  Do you have a fever, pain , or abdominal swelling? No. Pain Score  0 *  Have you tolerated food without any problems? Yes.    Have you been able to return to your normal activities? Yes.    Do you have any questions about your discharge instructions: Diet   No. Medications  No. Follow up visit  No.  Do you have questions or concerns about your Care? No.  Actions: * If pain score is 4 or above: No action needed, pain <4.  1. Have you developed a fever since your procedure? no  2.   Have you had an respiratory symptoms (SOB or cough) since your procedure? no  3.   Have you tested positive for COVID 19 since your procedure no  4.   Have you had any family members/close contacts diagnosed with the COVID 19 since your procedure?  no   If yes to any of these questions please route to Joylene John, RN and Erenest Rasher, RN

## 2019-11-17 ENCOUNTER — Encounter: Payer: Self-pay | Admitting: Gastroenterology

## 2020-08-21 ENCOUNTER — Encounter: Payer: Self-pay | Admitting: Internal Medicine

## 2020-09-04 ENCOUNTER — Encounter: Payer: BC Managed Care – PPO | Admitting: Internal Medicine

## 2020-09-06 ENCOUNTER — Encounter: Payer: BC Managed Care – PPO | Admitting: Internal Medicine

## 2020-09-14 ENCOUNTER — Other Ambulatory Visit: Payer: Self-pay

## 2020-09-14 ENCOUNTER — Ambulatory Visit (INDEPENDENT_AMBULATORY_CARE_PROVIDER_SITE_OTHER): Payer: BC Managed Care – PPO | Admitting: Internal Medicine

## 2020-09-14 ENCOUNTER — Encounter: Payer: Self-pay | Admitting: Internal Medicine

## 2020-09-14 VITALS — BP 120/70 | HR 82 | Temp 97.6°F | Ht 71.0 in | Wt 248.4 lb

## 2020-09-14 DIAGNOSIS — Z125 Encounter for screening for malignant neoplasm of prostate: Secondary | ICD-10-CM | POA: Diagnosis not present

## 2020-09-14 DIAGNOSIS — K219 Gastro-esophageal reflux disease without esophagitis: Secondary | ICD-10-CM

## 2020-09-14 DIAGNOSIS — Z13220 Encounter for screening for lipoid disorders: Secondary | ICD-10-CM | POA: Diagnosis not present

## 2020-09-14 DIAGNOSIS — F439 Reaction to severe stress, unspecified: Secondary | ICD-10-CM

## 2020-09-14 DIAGNOSIS — Z0000 Encounter for general adult medical examination without abnormal findings: Secondary | ICD-10-CM | POA: Diagnosis not present

## 2020-09-14 NOTE — Progress Notes (Signed)
Patient ID: Victor Craig, male   DOB: 04/24/1976, 45 y.o.   MRN: [id number]   Subjective:    Patient ID: Victor Craig, male    DOB: 19-Dec-1975, 45 y.o.   MRN: [id number]  HPI This visit occurred during the SARS-CoV-2 public health emergency.  Safety protocols were in place, including screening questions prior to the visit, additional usage of staff PPE, and extensive cleaning of exam room while observing appropriate contact time as indicated for disinfecting solutions.  Patient here for his physical exam. He reports he is doing well.  Working - Printmaker.  Stays active.  No chest pain or sob reported.  No abdominal pain or bowel change reported.    Past Medical History:  Diagnosis Date  . Allergy   . GERD (gastroesophageal reflux disease)   . Hiatal hernia   . Hx of migraines   . IBS (irritable bowel syndrome)   . Pancreatitis   . Serrated polyp of colon 06/2014  . Triceps tendon rupture    right   Past Surgical History:  Procedure Laterality Date  . COLONOSCOPY  06/21/2014  . COLONOSCOPY  11/09/2019  . LAPAROSCOPIC CHOLECYSTECTOMY    . POLYPECTOMY    . SPHINCTEROTOMY    . TONSILLECTOMY    . TRICEPS TENDON REPAIR Right 04/27/2015   Procedure: RIGHT DISTAL TRICEPS TENDON REPAIR;  Surgeon: [name], MD;  Location: [place];  Service: Orthopedics;  Laterality: Right;  . tricept  reattached Left   . ULNAR NERVE TRANSPOSITION Right 04/27/2015   Procedure: ULNAR NERVE DECOMPRESSION;  Surgeon: [name], MD;  Location: [place];  Service: Orthopedics;  Laterality: Right;   Family History  Problem Relation Age of Onset  . Colon polyps Father   . Prostate cancer Paternal Grandfather   . Colon cancer Paternal Grandfather   . Heart disease Maternal Grandfather   . Hypertension Maternal Grandfather   . Heart disease Mother   . Hypertension Mother   . Esophageal cancer Neg Hx   . Rectal cancer Neg Hx   . Stomach cancer Neg Hx     Social History   Socioeconomic History  . Marital status: Married    Spouse name: Not on file  . Number of children: 2  . Years of education: Not on file  . Highest education level: Not on file  Occupational History  . Occupation: Product manager: [employer]  Tobacco Use  . Smoking status: Never Smoker  . Smokeless tobacco: Never Used  Vaping Use  . Vaping Use: Never used  Substance and Sexual Activity  . Alcohol use: No  . Drug use: No  . Sexual activity: Not on file  Other Topics Concern  . Not on file  Social History Narrative  . Not on file   Social Determinants of Health   Financial Resource Strain: Not on file  Food Insecurity: Not on file  Transportation Needs: Not on file  Physical Activity: Not on file  Stress: Not on file  Social Connections: Not on file   Outpatient Encounter Medications as of 09/14/2020  Medication Sig  . FIBER PO Take by mouth.  . fluticasone (FLONASE) 50 MCG/ACT nasal spray SPRAY 2 SPRAYS INTO EACH NOSTRIL EVERY DAY  . Multiple Vitamin (MULTIVITAMIN ADULT PO) Take by mouth.  . RABEprazole (ACIPHEX) 20 MG tablet TAKE 1 TABLET (20 MG TOTAL) BY MOUTH 2 (TWO) TIMES DAILY BEFORE A MEAL.   No facility-administered encounter  medications on file as of 09/14/2020.    Review of Systems  Constitutional: Negative for appetite change and unexpected weight change.  HENT: Negative for congestion, sinus pressure and sore throat.   Eyes: Negative for pain and visual disturbance.  Respiratory: Negative for cough, chest tightness and shortness of breath.   Cardiovascular: Negative for chest pain, palpitations and leg swelling.  Gastrointestinal: Negative for abdominal pain, diarrhea, nausea and vomiting.  Genitourinary: Negative for difficulty urinating and dysuria.  Musculoskeletal: Negative for back pain, joint swelling and myalgias.  Skin: Negative for color change and rash.  Neurological: Negative for dizziness,  light-headedness and headaches.  Hematological: Negative for adenopathy. Does not bruise/bleed easily.  Psychiatric/Behavioral: Negative for agitation and dysphoric mood.       Objective:    Physical Exam Vitals reviewed.  Constitutional:      General: He is not in acute distress.    Appearance: Normal appearance. He is well-developed.  HENT:     Head: Normocephalic and atraumatic.     Right Ear: External ear normal.     Left Ear: External ear normal.     Mouth/Throat:     Pharynx: No oropharyngeal exudate.  Eyes:     General: No scleral icterus.       Right eye: No discharge.        Left eye: No discharge.     Conjunctiva/sclera: Conjunctivae normal.  Neck:     Thyroid: No thyromegaly.  Cardiovascular:     Rate and Rhythm: Normal rate and regular rhythm.  Pulmonary:     Effort: No respiratory distress.     Breath sounds: Normal breath sounds. No wheezing.  Abdominal:     General: Bowel sounds are normal.     Palpations: Abdomen is soft.     Tenderness: There is no abdominal tenderness.  Musculoskeletal:        General: No swelling or tenderness.     Cervical back: Neck supple. No tenderness.  Lymphadenopathy:     Cervical: No cervical adenopathy.  Skin:    Findings: No erythema or rash.  Neurological:     Mental Status: He is alert and oriented to person, place, and time.  Psychiatric:        Mood and Affect: Mood normal.        Behavior: Behavior normal.     BP 120/70   Pulse 82   Temp 97.6 F (36.4 C) (Oral)   Ht 5\' 11"  (1.803 m)   Wt 248 lb 6.4 oz (112.7 kg)   SpO2 97%   BMI 34.64 kg/m  Wt Readings from Last 3 Encounters:  09/14/20 248 lb 6.4 oz (112.7 kg)  11/09/19 249 lb (112.9 kg)  10/26/19 249 lb (112.9 kg)     Lab Results  Component Value Date   WBC 4.7 09/14/2020   HGB 15.1 09/14/2020   HCT 43.4 09/14/2020   PLT 181.0 09/14/2020   GLUCOSE 84 09/14/2020   CHOL 146 09/14/2020   TRIG 89.0 09/14/2020   HDL 32.40 (L) 09/14/2020    LDLCALC 96 09/14/2020   ALT 30 09/14/2020   AST 23 09/14/2020   NA 138 09/14/2020   K 4.0 09/14/2020   CL 104 09/14/2020   CREATININE 1.09 09/14/2020   BUN 22 09/14/2020   CO2 26 09/14/2020   TSH 1.74 09/14/2020   PSA 0.50 09/14/2020   INR 0.9 03/15/2012       Assessment & Plan:   Problem List Items Addressed This Visit  GERD    No acid reflux.  Controlled on aciphex.        Health care maintenance    Physical today 09/14/20.  Colonoscopy 10/2019 - 70mm polyp transverse colon and internal hemorrhoids.  Check psa.        Stress    Handling stress.  Follow.        Other Visit Diagnoses    Screening cholesterol level    -  Primary   Relevant Orders   CBC with Differential/Platelet (Completed)   Comprehensive metabolic panel (Completed)   TSH (Completed)   Lipid panel (Completed)   Prostate cancer screening       Relevant Orders   PSA (Completed)       Einar Pheasant, MD

## 2020-09-15 LAB — COMPREHENSIVE METABOLIC PANEL
ALT: 30 U/L (ref 0–53)
AST: 23 U/L (ref 0–37)
Albumin: 4.4 g/dL (ref 3.5–5.2)
Alkaline Phosphatase: 28 U/L — ABNORMAL LOW (ref 39–117)
BUN: 22 mg/dL (ref 6–23)
CO2: 26 mEq/L (ref 19–32)
Calcium: 9.4 mg/dL (ref 8.4–10.5)
Chloride: 104 mEq/L (ref 96–112)
Creatinine, Ser: 1.09 mg/dL (ref 0.40–1.50)
GFR: 81.01 mL/min (ref >60.00)
Glucose, Bld: 84 mg/dL (ref 70–99)
Potassium: 4 mEq/L (ref 3.5–5.1)
Sodium: 138 mEq/L (ref 135–145)
Total Bilirubin: 0.3 mg/dL (ref 0.2–1.2)
Total Protein: 6.4 g/dL (ref 6.0–8.3)

## 2020-09-15 LAB — LIPID PANEL
Cholesterol: 146 mg/dL (ref 0–200)
HDL: 32.4 mg/dL — ABNORMAL LOW (ref >39.00)
LDL Cholesterol: 96 mg/dL (ref 0–99)
NonHDL: 113.65
Total CHOL/HDL Ratio: 5
Triglycerides: 89 mg/dL (ref 0.0–149.0)
VLDL: 17.8 mg/dL (ref 0.0–40.0)

## 2020-09-15 LAB — CBC WITH DIFFERENTIAL/PLATELET
Basophils Absolute: 0 10*3/uL (ref 0.0–0.1)
Basophils Relative: 0.5 % (ref 0.0–3.0)
Eosinophils Absolute: 0.2 10*3/uL (ref 0.0–0.7)
Eosinophils Relative: 4 % (ref 0.0–5.0)
HCT: 43.4 % (ref 39.0–52.0)
Hemoglobin: 15.1 g/dL (ref 13.0–17.0)
Lymphocytes Relative: 30.5 % (ref 12.0–46.0)
Lymphs Abs: 1.4 10*3/uL (ref 0.7–4.0)
MCHC: 34.7 g/dL (ref 30.0–36.0)
MCV: 88.8 fl (ref 78.0–100.0)
Monocytes Absolute: 0.3 10*3/uL (ref 0.1–1.0)
Monocytes Relative: 6.5 % (ref 3.0–12.0)
Neutro Abs: 2.8 10*3/uL (ref 1.4–7.7)
Neutrophils Relative %: 58.5 % (ref 43.0–77.0)
Platelets: 181 10*3/uL (ref 150.0–400.0)
RBC: 4.89 Mil/uL (ref 4.22–5.81)
RDW: 12.8 % (ref 11.5–15.5)
WBC: 4.7 10*3/uL (ref 4.0–10.5)

## 2020-09-15 LAB — TSH: TSH: 1.74 u[IU]/mL (ref 0.35–4.50)

## 2020-09-15 LAB — PSA: PSA: 0.5 ng/mL (ref 0.10–4.00)

## 2020-09-23 ENCOUNTER — Encounter: Payer: Self-pay | Admitting: Internal Medicine

## 2020-09-23 ENCOUNTER — Telehealth: Payer: Self-pay | Admitting: Internal Medicine

## 2020-09-23 NOTE — Telephone Encounter (Signed)
Please call Victor Craig and confirm if he has had covid vaccine.  I don't think he has had, but need to confirm and postpone.  Thanks.

## 2020-09-23 NOTE — Assessment & Plan Note (Signed)
Physical today 09/14/20.  Colonoscopy 10/2019 - 67mm polyp transverse colon and internal hemorrhoids.  Check psa.

## 2020-09-23 NOTE — Assessment & Plan Note (Signed)
No acid reflux.  Controlled on aciphex.

## 2020-09-23 NOTE — Assessment & Plan Note (Signed)
Handling stress.  Follow.   

## 2020-09-26 NOTE — Telephone Encounter (Signed)
LMTCB

## 2020-10-04 ENCOUNTER — Encounter: Payer: Self-pay | Admitting: Internal Medicine

## 2020-10-04 NOTE — Telephone Encounter (Signed)
LMTCB

## 2020-10-04 NOTE — Telephone Encounter (Signed)
He has had the covid vaccines. When he gets home he is going to call or send my chart message with the dates to put in chart

## 2020-10-04 NOTE — Telephone Encounter (Signed)
Pt called returning your call 

## 2020-11-14 ENCOUNTER — Other Ambulatory Visit: Payer: Self-pay

## 2020-11-14 ENCOUNTER — Telehealth: Payer: Self-pay | Admitting: Internal Medicine

## 2020-11-14 MED ORDER — RABEPRAZOLE SODIUM 20 MG PO TBEC: 20.0000 mg | DELAYED_RELEASE_TABLET | Freq: Two times a day (BID) | ORAL | 1 refills | Status: DC

## 2020-11-14 NOTE — Telephone Encounter (Signed)
Medication has been ordered.

## 2020-11-14 NOTE — Telephone Encounter (Signed)
Pt needs a refill on RABEprazole (ACIPHEX) 20 MG tablet sent to [facility]

## 2021-08-26 ENCOUNTER — Other Ambulatory Visit: Payer: Self-pay | Admitting: Internal Medicine

## 2021-09-14 ENCOUNTER — Encounter: Payer: BC Managed Care – PPO | Admitting: Internal Medicine

## 2021-11-09 ENCOUNTER — Encounter: Payer: Self-pay | Admitting: Internal Medicine

## 2021-11-09 ENCOUNTER — Ambulatory Visit (INDEPENDENT_AMBULATORY_CARE_PROVIDER_SITE_OTHER): Payer: BC Managed Care – PPO | Admitting: Internal Medicine

## 2021-11-09 VITALS — BP 122/76 | HR 70 | Temp 98.7°F | Resp 18 | Ht 71.0 in | Wt 246.4 lb

## 2021-11-09 DIAGNOSIS — Z0000 Encounter for general adult medical examination without abnormal findings: Secondary | ICD-10-CM | POA: Diagnosis not present

## 2021-11-09 DIAGNOSIS — Z86010 Personal history of colonic polyps: Secondary | ICD-10-CM | POA: Diagnosis not present

## 2021-11-09 DIAGNOSIS — Z9109 Other allergy status, other than to drugs and biological substances: Secondary | ICD-10-CM | POA: Diagnosis not present

## 2021-11-09 DIAGNOSIS — Z13220 Encounter for screening for lipoid disorders: Secondary | ICD-10-CM | POA: Diagnosis not present

## 2021-11-09 DIAGNOSIS — K219 Gastro-esophageal reflux disease without esophagitis: Secondary | ICD-10-CM | POA: Diagnosis not present

## 2021-11-09 DIAGNOSIS — Z125 Encounter for screening for malignant neoplasm of prostate: Secondary | ICD-10-CM

## 2021-11-09 LAB — CBC WITH DIFFERENTIAL/PLATELET
Basophils Absolute: 0 10*3/uL (ref 0.0–0.1)
Basophils Relative: 0.8 % (ref 0.0–3.0)
Eosinophils Absolute: 0.2 10*3/uL (ref 0.0–0.7)
Eosinophils Relative: 5 % (ref 0.0–5.0)
HCT: 45.9 % (ref 39.0–52.0)
Hemoglobin: 15.2 g/dL (ref 13.0–17.0)
Lymphocytes Relative: 29 % (ref 12.0–46.0)
Lymphs Abs: 1.2 10*3/uL (ref 0.7–4.0)
MCHC: 33 g/dL (ref 30.0–36.0)
MCV: 90.8 fl (ref 78.0–100.0)
Monocytes Absolute: 0.4 10*3/uL (ref 0.1–1.0)
Monocytes Relative: 8.7 % (ref 3.0–12.0)
Neutro Abs: 2.3 10*3/uL (ref 1.4–7.7)
Neutrophils Relative %: 56.5 % (ref 43.0–77.0)
Platelets: 169 10*3/uL (ref 150.0–400.0)
RBC: 5.06 Mil/uL (ref 4.22–5.81)
RDW: 13.3 % (ref 11.5–15.5)
WBC: 4.1 10*3/uL (ref 4.0–10.5)

## 2021-11-09 LAB — COMPREHENSIVE METABOLIC PANEL
ALT: 47 U/L (ref 0–53)
AST: 28 U/L (ref 0–37)
Albumin: 4.3 g/dL (ref 3.5–5.2)
Alkaline Phosphatase: 25 U/L — ABNORMAL LOW (ref 39–117)
BUN: 20 mg/dL (ref 6–23)
CO2: 28 mEq/L (ref 19–32)
Calcium: 9.4 mg/dL (ref 8.4–10.5)
Chloride: 104 mEq/L (ref 96–112)
Creatinine, Ser: 1.19 mg/dL (ref 0.40–1.50)
GFR: 72.32 mL/min (ref >60.00)
Glucose, Bld: 82 mg/dL (ref 70–99)
Potassium: 4.4 mEq/L (ref 3.5–5.1)
Sodium: 140 mEq/L (ref 135–145)
Total Bilirubin: 0.7 mg/dL (ref 0.2–1.2)
Total Protein: 6.5 g/dL (ref 6.0–8.3)

## 2021-11-09 LAB — LIPID PANEL
Cholesterol: 192 mg/dL (ref 0–200)
HDL: 38.4 mg/dL — ABNORMAL LOW (ref >39.00)
LDL Cholesterol: 132 mg/dL — ABNORMAL HIGH (ref 0–99)
NonHDL: 153.81
Total CHOL/HDL Ratio: 5
Triglycerides: 109 mg/dL (ref 0.0–149.0)
VLDL: 21.8 mg/dL (ref 0.0–40.0)

## 2021-11-09 LAB — PSA: PSA: 0.47 ng/mL (ref 0.10–4.00)

## 2021-11-09 LAB — TSH: TSH: 2.04 u[IU]/mL (ref 0.35–5.50)

## 2021-11-09 MED ORDER — RABEPRAZOLE SODIUM 20 MG PO TBEC
20.0000 mg | DELAYED_RELEASE_TABLET | Freq: Every day | ORAL | 1 refills | Status: DC
Start: 2021-11-09 — End: 2022-05-09

## 2021-11-09 MED ORDER — FLUTICASONE PROPIONATE 50 MCG/ACT NA SUSP
NASAL | 3 refills | Status: DC
Start: 2021-11-09 — End: 2022-11-15

## 2021-11-10 ENCOUNTER — Encounter: Payer: Self-pay | Admitting: Internal Medicine

## 2021-11-10 NOTE — Assessment & Plan Note (Signed)
Doing well on flonase.

## 2022-03-11 ENCOUNTER — Encounter: Payer: Self-pay | Admitting: Internal Medicine

## 2022-03-13 ENCOUNTER — Ambulatory Visit: Payer: BC Managed Care – PPO | Admitting: Internal Medicine

## 2022-03-13 ENCOUNTER — Encounter: Payer: Self-pay | Admitting: Internal Medicine

## 2022-03-13 DIAGNOSIS — K219 Gastro-esophageal reflux disease without esophagitis: Secondary | ICD-10-CM | POA: Diagnosis not present

## 2022-03-13 DIAGNOSIS — Z01818 Encounter for other preprocedural examination: Secondary | ICD-10-CM | POA: Diagnosis not present

## 2022-03-13 DIAGNOSIS — Z86010 Personal history of colonic polyps: Secondary | ICD-10-CM | POA: Diagnosis not present

## 2022-03-13 LAB — CBC WITH DIFFERENTIAL/PLATELET
Basophils Absolute: 0 10*3/uL (ref 0.0–0.1)
Basophils Relative: 0.6 % (ref 0.0–3.0)
Eosinophils Absolute: 0.3 10*3/uL (ref 0.0–0.7)
Eosinophils Relative: 5.1 % — ABNORMAL HIGH (ref 0.0–5.0)
HCT: 42.6 % (ref 39.0–52.0)
Hemoglobin: 14.4 g/dL (ref 13.0–17.0)
Lymphocytes Relative: 20.6 % (ref 12.0–46.0)
Lymphs Abs: 1.2 10*3/uL (ref 0.7–4.0)
MCHC: 33.8 g/dL (ref 30.0–36.0)
MCV: 89.5 fl (ref 78.0–100.0)
Monocytes Absolute: 0.5 10*3/uL (ref 0.1–1.0)
Monocytes Relative: 7.9 % (ref 3.0–12.0)
Neutro Abs: 3.8 10*3/uL (ref 1.4–7.7)
Neutrophils Relative %: 65.8 % (ref 43.0–77.0)
Platelets: 168 10*3/uL (ref 150.0–400.0)
RBC: 4.76 Mil/uL (ref 4.22–5.81)
RDW: 12.9 % (ref 11.5–15.5)
WBC: 5.7 10*3/uL (ref 4.0–10.5)

## 2022-03-13 LAB — BASIC METABOLIC PANEL
BUN: 28 mg/dL — ABNORMAL HIGH (ref 6–23)
CO2: 28 mEq/L (ref 19–32)
Calcium: 9.4 mg/dL (ref 8.4–10.5)
Chloride: 105 mEq/L (ref 96–112)
Creatinine, Ser: 1.22 mg/dL (ref 0.40–1.50)
GFR: 70.03 mL/min (ref >60.00)
Glucose, Bld: 78 mg/dL (ref 70–99)
Potassium: 4.1 mEq/L (ref 3.5–5.1)
Sodium: 139 mEq/L (ref 135–145)

## 2022-03-13 NOTE — Progress Notes (Signed)
Patient ID: KENY DONALD, male   DOB: February 25, 1976, 46 y.o.   MRN: [id number]   Subjective:    Patient ID: MONIQUE GIFT, male    DOB: May 01, 1976, 46 y.o.   MRN: [id number]   Patient here for  Chief Complaint  Patient presents with   Pre-op Exam   .   HPI Work in for pre op evaluation.  Planning for knee replacement - Dr Chiquita Loth.  Reports that 3 years ago - injured - landed on his knees.  Has noticed issues recently with officiating.  Is s/p injections.  Would improve temporarily.  MRI - bone on bone.  Planning for right partial knee replacement.  Reports otherwise doing well.  No chest pain.  No sob.  No nausea or vomiting.  No diarrhea or bowel change reported.     Past Medical History:  Diagnosis Date   Allergy    GERD (gastroesophageal reflux disease)    Hiatal hernia    Hx of migraines    IBS (irritable bowel syndrome)    Pancreatitis    Serrated polyp of colon 06/2014   Triceps tendon rupture    right   Past Surgical History:  Procedure Laterality Date   COLONOSCOPY  06/21/2014   COLONOSCOPY  11/09/2019   LAPAROSCOPIC CHOLECYSTECTOMY     POLYPECTOMY     SPHINCTEROTOMY     TONSILLECTOMY     TRICEPS TENDON REPAIR Right 04/27/2015   Procedure: RIGHT DISTAL TRICEPS TENDON REPAIR;  Surgeon: [name], MD;  Location: [place];  Service: Orthopedics;  Laterality: Right;   tricept  reattached Left    ULNAR NERVE TRANSPOSITION Right 04/27/2015   Procedure: ULNAR NERVE DECOMPRESSION;  Surgeon: [name], MD;  Location: [place];  Service: Orthopedics;  Laterality: Right;   Family History  Problem Relation Age of Onset   Colon polyps Father    Prostate cancer Paternal Grandfather    Colon cancer Paternal Grandfather    Heart disease Maternal Grandfather    Hypertension Maternal Grandfather    Heart disease Mother    Hypertension Mother    Esophageal cancer Neg Hx    Rectal cancer Neg Hx    Stomach cancer Neg Hx     Social History   Socioeconomic History   Marital status: Married    Spouse name: Not on file   Number of children: 2   Years of education: Not on file   Highest education level: Not on file  Occupational History   Occupation: Product manager: [employer]  Tobacco Use   Smoking status: Never   Smokeless tobacco: Never  Vaping Use   Vaping Use: Never used  Substance and Sexual Activity   Alcohol use: No   Drug use: No   Sexual activity: Not on file  Other Topics Concern   Not on file  Social History Narrative   Not on file   Social Determinants of Health   Financial Resource Strain: Not on file  Food Insecurity: Not on file  Transportation Needs: Not on file  Physical Activity: Not on file  Stress: Not on file  Social Connections: Not on file     Review of Systems  Constitutional:  Negative for appetite change and unexpected weight change.  HENT:  Negative for congestion and sinus pressure.   Respiratory:  Negative for cough, chest tightness and shortness of breath.   Cardiovascular:  Negative for chest pain, palpitations and leg  swelling.  Gastrointestinal:  Negative for abdominal pain, diarrhea, nausea and vomiting.  Genitourinary:  Negative for difficulty urinating and dysuria.  Musculoskeletal:  Negative for joint swelling and myalgias.  Skin:  Negative for color change and rash.  Neurological:  Negative for dizziness, light-headedness and headaches.  Psychiatric/Behavioral:  Negative for agitation and dysphoric mood.        Objective:     BP 138/64 (BP Location: Left Arm, Patient Position: Sitting, Cuff Size: Large)   Pulse 74   Temp 98.8 F (37.1 C) (Oral)   Ht '5\' 11"'$  (1.803 m)   Wt 245 lb 3.2 oz (111.2 kg)   SpO2 97%   BMI 34.20 kg/m  Wt Readings from Last 3 Encounters:  03/13/22 245 lb 3.2 oz (111.2 kg)  11/09/21 246 lb 6.4 oz (111.8 kg)  09/14/20 248 lb 6.4 oz (112.7 kg)    Physical Exam Constitutional:       General: He is not in acute distress.    Appearance: Normal appearance. He is well-developed.  HENT:     Head: Normocephalic and atraumatic.     Right Ear: External ear normal.     Left Ear: External ear normal.  Eyes:     General: No scleral icterus.       Right eye: No discharge.        Left eye: No discharge.  Cardiovascular:     Rate and Rhythm: Normal rate and regular rhythm.  Pulmonary:     Effort: Pulmonary effort is normal. No respiratory distress.     Breath sounds: Normal breath sounds.  Abdominal:     General: Bowel sounds are normal.     Palpations: Abdomen is soft.     Tenderness: There is no abdominal tenderness.  Musculoskeletal:        General: No swelling or tenderness.     Cervical back: Neck supple. No tenderness.  Lymphadenopathy:     Cervical: No cervical adenopathy.  Skin:    Findings: No erythema or rash.  Neurological:     Mental Status: He is alert.  Psychiatric:        Mood and Affect: Mood normal.        Behavior: Behavior normal.      Outpatient Encounter Medications as of 03/13/2022  Medication Sig   FIBER PO Take by mouth.   fluticasone (FLONASE) 50 MCG/ACT nasal spray SPRAY 2 SPRAYS INTO EACH NOSTRIL EVERY DAY   Multiple Vitamin (MULTIVITAMIN ADULT PO) Take by mouth.   RABEprazole (ACIPHEX) 20 MG tablet Take 1 tablet (20 mg total) by mouth daily.   No facility-administered encounter medications on file as of 03/13/2022.     Lab Results  Component Value Date   WBC 5.7 03/13/2022   HGB 14.4 03/13/2022   HCT 42.6 03/13/2022   PLT 168.0 03/13/2022   GLUCOSE 78 03/13/2022   CHOL 192 11/09/2021   TRIG 109.0 11/09/2021   HDL 38.40 (L) 11/09/2021   LDLCALC 132 (H) 11/09/2021   ALT 47 11/09/2021   AST 28 11/09/2021   NA 139 03/13/2022   K 4.1 03/13/2022   CL 105 03/13/2022   CREATININE 1.22 03/13/2022   BUN 28 (H) 03/13/2022   CO2 28 03/13/2022   TSH 2.04 11/09/2021   PSA 0.47 11/09/2021   INR 0.9 03/15/2012       Assessment  & Plan:   Problem List Items Addressed This Visit     GERD    No acid reflux reported.  On  aciphex.       History of colonic polyps    Colonoscopy 2021 - One 6 mm polyp in the transverse colon.  Internal hemorrhoids. The examination was otherwise normal      Pre-op evaluation    Planning for knee surgery as outlined.  Reports no chest pain or sob.  EKG - SR with no acute ischemic changes.  Given currently without symptoms and EKG findings, I feel that he is at low risk to proceed with the planned surgery.  He will need close intra op and post op monitoring of heart rate and blood pressure to avoid extremes.  Discussed need to avoid antiinflammatories, fish oil, supplements one week prior to surgery.        Relevant Orders   EKG 12-Lead (Completed)   CBC with Differential/Platelet (Completed)   Basic metabolic panel (Completed)     Einar Pheasant, MD

## 2022-03-15 ENCOUNTER — Encounter: Payer: Self-pay | Admitting: Internal Medicine

## 2022-03-16 ENCOUNTER — Encounter: Payer: Self-pay | Admitting: Internal Medicine

## 2022-03-16 NOTE — Assessment & Plan Note (Signed)
No acid reflux reported.  On aciphex.

## 2022-03-16 NOTE — Telephone Encounter (Signed)
Form has been completed.  In box at [employer].  Please make sure last office note and attached EKG along with the form is faxed.

## 2022-03-16 NOTE — Assessment & Plan Note (Signed)
Colonoscopy 2021 - One 6 mm polyp in the transverse colon.  Internal hemorrhoids. The examination was otherwise normal

## 2022-03-16 NOTE — Assessment & Plan Note (Signed)
Planning for knee surgery as outlined.  Reports no chest pain or sob.  EKG - SR with no acute ischemic changes.  Given currently without symptoms and EKG findings, I feel that he is at low risk to proceed with the planned surgery.  He will need close intra op and post op monitoring of heart rate and blood pressure to avoid extremes.  Discussed need to avoid antiinflammatories, fish oil, supplements one week prior to surgery.

## 2022-05-09 ENCOUNTER — Other Ambulatory Visit: Payer: Self-pay | Admitting: Internal Medicine

## 2022-05-20 ENCOUNTER — Encounter: Payer: Self-pay | Admitting: Internal Medicine

## 2022-05-21 MED ORDER — RABEPRAZOLE SODIUM 20 MG PO TBEC: 20.0000 mg | DELAYED_RELEASE_TABLET | Freq: Every day | ORAL | 1 refills | Status: DC

## 2022-06-10 ENCOUNTER — Telehealth: Payer: Self-pay | Admitting: Internal Medicine

## 2022-06-10 NOTE — Telephone Encounter (Signed)
Surgery Clearance form received by fax on 06/10/22. Form is up front in Dr [employer] color folder.

## 2022-06-11 ENCOUNTER — Encounter: Payer: Self-pay | Admitting: Internal Medicine

## 2022-06-11 NOTE — Telephone Encounter (Signed)
Pt called back in. Note below was read to him about scheduling an appt. He doesn't understand why another Pre-op appt when he already had one before his knee replacement surgery.

## 2022-06-11 NOTE — Telephone Encounter (Signed)
LMTCB. If patient returns call please schedule him for a pre-op appt with Dr Nicki Reaper prior to 07/05/2022. Procedure is scheduled for 07/10/22

## 2022-06-12 NOTE — Telephone Encounter (Signed)
Discussed with pt. Would like to use 10/25 pre op appt. Going to reach out to [employer] to see if this is ok

## 2022-06-13 ENCOUNTER — Encounter: Payer: Self-pay | Admitting: Internal Medicine

## 2022-06-13 NOTE — Telephone Encounter (Signed)
LM for Claiborne Billings at [facility] to return my call to discuss.

## 2022-06-17 NOTE — Telephone Encounter (Signed)
Called [facility] again- call was disconnected.

## 2022-06-18 NOTE — Telephone Encounter (Signed)
See me before calling. Please let him know that he will need repeat labs.  Also, please schedule him an appt, but let him know he will not need a f/u EKG unless symptoms have changed.

## 2022-06-18 NOTE — Telephone Encounter (Signed)
Pt scheduled for pre op tomorrow at 7

## 2022-06-18 NOTE — Telephone Encounter (Signed)
Spoke with [employer] at [facility]. She advised that pre-op appt does not need to be redone. He will need to have labs requested repeated because they are only valid for 3 months but pre op visit is valid for 6 months. Are you ok with me scheduling him for labs prior to surgery? We did pre-op with him in Oct for his right knee. Left knee is scheduled 07/10/22.

## 2022-06-18 NOTE — Telephone Encounter (Signed)
LM for patient. We do need to do pre-op. Potentially trying to schedule for 1/31 at 7:00. If this works for him- please schedule. If not or if he has questions let me know.

## 2022-06-19 ENCOUNTER — Encounter: Payer: Self-pay | Admitting: Internal Medicine

## 2022-06-19 ENCOUNTER — Ambulatory Visit: Payer: BC Managed Care – PPO | Admitting: Internal Medicine

## 2022-06-19 VITALS — BP 120/74 | HR 78 | Temp 98.3°F | Resp 16 | Ht 71.0 in | Wt 243.8 lb

## 2022-06-19 DIAGNOSIS — Z01818 Encounter for other preprocedural examination: Secondary | ICD-10-CM

## 2022-06-19 DIAGNOSIS — K219 Gastro-esophageal reflux disease without esophagitis: Secondary | ICD-10-CM

## 2022-06-19 DIAGNOSIS — E7800 Pure hypercholesterolemia, unspecified: Secondary | ICD-10-CM

## 2022-06-19 LAB — CBC WITH DIFFERENTIAL/PLATELET
Basophils Absolute: 0 10*3/uL (ref 0.0–0.1)
Basophils Relative: 0.6 % (ref 0.0–3.0)
Eosinophils Absolute: 2.1 10*3/uL — ABNORMAL HIGH (ref 0.0–0.7)
Eosinophils Relative: 33.2 % — ABNORMAL HIGH (ref 0.0–5.0)
HCT: 41.3 % (ref 39.0–52.0)
Hemoglobin: 14.2 g/dL (ref 13.0–17.0)
Lymphocytes Relative: 20.5 % (ref 12.0–46.0)
Lymphs Abs: 1.3 10*3/uL (ref 0.7–4.0)
MCHC: 34.5 g/dL (ref 30.0–36.0)
MCV: 88.8 fl (ref 78.0–100.0)
Monocytes Absolute: 0.4 10*3/uL (ref 0.1–1.0)
Monocytes Relative: 6 % (ref 3.0–12.0)
Neutro Abs: 2.5 10*3/uL (ref 1.4–7.7)
Neutrophils Relative %: 39.7 % — ABNORMAL LOW (ref 43.0–77.0)
Platelets: 167 10*3/uL (ref 150.0–400.0)
RBC: 4.65 Mil/uL (ref 4.22–5.81)
RDW: 13.1 % (ref 11.5–15.5)
WBC: 6.4 10*3/uL (ref 4.0–10.5)

## 2022-06-19 LAB — LIPID PANEL
Cholesterol: 190 mg/dL (ref 0–200)
HDL: 33.7 mg/dL — ABNORMAL LOW (ref >39.00)
LDL Cholesterol: 140 mg/dL — ABNORMAL HIGH (ref 0–99)
NonHDL: 155.89
Total CHOL/HDL Ratio: 6
Triglycerides: 80 mg/dL (ref 0.0–149.0)
VLDL: 16 mg/dL (ref 0.0–40.0)

## 2022-06-19 LAB — BASIC METABOLIC PANEL
BUN: 19 mg/dL (ref 6–23)
CO2: 29 mEq/L (ref 19–32)
Calcium: 8.9 mg/dL (ref 8.4–10.5)
Chloride: 103 mEq/L (ref 96–112)
Creatinine, Ser: 1.19 mg/dL (ref 0.40–1.50)
GFR: 72.01 mL/min (ref >60.00)
Glucose, Bld: 88 mg/dL (ref 70–99)
Potassium: 4.3 mEq/L (ref 3.5–5.1)
Sodium: 138 mEq/L (ref 135–145)

## 2022-06-19 NOTE — Progress Notes (Signed)
Subjective:    Patient ID: Victor Craig, male    DOB: [birth year], 47 y.o.   MRN: [id number]  Patient here for  Chief Complaint  Patient presents with   Pre-op Exam    HPI Here for pre op evaluation.  Is doing well.  Feels good.  Recent right knee surgery - 4 weeks ago.  Did well with the surgery.  No chest pain or sob reported.  No cough or congestion.  No abdominal pain or cramping.  No bowel issues reported.  Overall feels good.    Past Medical History:  Diagnosis Date   Allergy    GERD (gastroesophageal reflux disease)    Hiatal hernia    Hx of migraines    IBS (irritable bowel syndrome)    Pancreatitis    Serrated polyp of colon 06/2014   Triceps tendon rupture    right   Past Surgical History:  Procedure Laterality Date   COLONOSCOPY  06/21/2014   COLONOSCOPY  11/09/2019   LAPAROSCOPIC CHOLECYSTECTOMY     POLYPECTOMY     SPHINCTEROTOMY     TONSILLECTOMY     TRICEPS TENDON REPAIR Right 04/27/2015   Procedure: RIGHT DISTAL TRICEPS TENDON REPAIR;  Surgeon: [name], MD;  Location: [place];  Service: Orthopedics;  Laterality: Right;   tricept  reattached Left    ULNAR NERVE TRANSPOSITION Right 04/27/2015   Procedure: ULNAR NERVE DECOMPRESSION;  Surgeon: [name], MD;  Location: [place];  Service: Orthopedics;  Laterality: Right;   Family History  Problem Relation Age of Onset   Colon polyps Father    Prostate cancer Paternal Grandfather    Colon cancer Paternal Grandfather    Heart disease Maternal Grandfather    Hypertension Maternal Grandfather    Heart disease Mother    Hypertension Mother    Esophageal cancer Neg Hx    Rectal cancer Neg Hx    Stomach cancer Neg Hx    Social History   Socioeconomic History   Marital status: Married    Spouse name: Not on file   Number of children: 2   Years of education: Not on file   Highest education level: Not on file  Occupational History   Occupation: Associate Professor: [employer]  Tobacco Use   Smoking status: Never   Smokeless tobacco: Never  Vaping Use   Vaping Use: Never used  Substance and Sexual Activity   Alcohol use: No   Drug use: No   Sexual activity: Not on file  Other Topics Concern   Not on file  Social History Narrative   Not on file   Social Determinants of Health   Financial Resource Strain: Not on file  Food Insecurity: Not on file  Transportation Needs: Not on file  Physical Activity: Not on file  Stress: Not on file  Social Connections: Not on file     Review of Systems  Constitutional:  Negative for appetite change and unexpected weight change.  HENT:  Negative for congestion and sinus pressure.   Respiratory:  Negative for cough, chest tightness and shortness of breath.   Cardiovascular:  Negative for chest pain, palpitations and leg swelling.  Gastrointestinal:  Negative for abdominal pain, diarrhea, nausea and vomiting.  Genitourinary:  Negative for difficulty urinating and dysuria.  Musculoskeletal:  Negative for joint swelling and myalgias.  Skin:  Negative for color change and rash.  Neurological:  Negative for dizziness and  headaches.  Psychiatric/Behavioral:  Negative for agitation and dysphoric mood.        Objective:     BP 120/74   Pulse 78   Temp 98.3 F (36.8 C)   Resp 16   Ht '5\' 11"'$  (1.803 m)   Wt 243 lb 12.8 oz (110.6 kg)   SpO2 98%   BMI 34.00 kg/m  Wt Readings from Last 3 Encounters:  06/19/22 243 lb 12.8 oz (110.6 kg)  03/13/22 245 lb 3.2 oz (111.2 kg)  11/09/21 246 lb 6.4 oz (111.8 kg)    Physical Exam Vitals reviewed.  Constitutional:      General: He is not in acute distress.    Appearance: Normal appearance. He is well-developed.  HENT:     Head: Normocephalic and atraumatic.     Right Ear: External ear normal.     Left Ear: External ear normal.  Eyes:     General: No scleral icterus.       Right eye: No discharge.        Left eye:  No discharge.     Conjunctiva/sclera: Conjunctivae normal.  Cardiovascular:     Rate and Rhythm: Normal rate and regular rhythm.  Pulmonary:     Effort: Pulmonary effort is normal. No respiratory distress.     Breath sounds: Normal breath sounds.  Abdominal:     General: Bowel sounds are normal.     Palpations: Abdomen is soft.     Tenderness: There is no abdominal tenderness.  Musculoskeletal:        General: No swelling or tenderness.     Cervical back: Neck supple. No tenderness.  Lymphadenopathy:     Cervical: No cervical adenopathy.  Skin:    Findings: No erythema or rash.  Neurological:     Mental Status: He is alert.  Psychiatric:        Mood and Affect: Mood normal.        Behavior: Behavior normal.      Outpatient Encounter Medications as of 06/19/2022  Medication Sig   FIBER PO Take by mouth.   fluticasone (FLONASE) 50 MCG/ACT nasal spray SPRAY 2 SPRAYS INTO EACH NOSTRIL EVERY DAY   Multiple Vitamin (MULTIVITAMIN ADULT PO) Take by mouth.   RABEprazole (ACIPHEX) 20 MG tablet Take 1 tablet (20 mg total) by mouth daily.   No facility-administered encounter medications on file as of 06/19/2022.     Lab Results  Component Value Date   WBC 6.4 06/19/2022   HGB 14.2 06/19/2022   HCT 41.3 06/19/2022   PLT 167.0 06/19/2022   GLUCOSE 88 06/19/2022   CHOL 190 06/19/2022   TRIG 80.0 06/19/2022   HDL 33.70 (L) 06/19/2022   LDLCALC 140 (H) 06/19/2022   ALT 47 11/09/2021   AST 28 11/09/2021   NA 138 06/19/2022   K 4.3 06/19/2022   CL 103 06/19/2022   CREATININE 1.19 06/19/2022   BUN 19 06/19/2022   CO2 29 06/19/2022   TSH 2.04 11/09/2021   PSA 0.47 11/09/2021   INR 0.9 03/15/2012    No results found.     Assessment & Plan:  Pre-op evaluation Assessment & Plan: Planning for knee surgery as outlined.  Reports no chest pain or sob.  Previous EKG - SR with no acute ischemic changes.  Given currently without symptoms and EKG findings and recent surgery,  I feel  that he is at low risk to proceed with the planned surgery.  He will need close intra op and  post op monitoring of heart rate and blood pressure to avoid extremes.  Labs drawn.  Will review labs once available.   Orders: -     CBC with Differential/Platelet -     Basic metabolic panel  Hypercholesterolemia Assessment & Plan: Low cholesterol diet and exercise.  Follow lipid panel.   Orders: -     Lipid panel  Gastroesophageal reflux disease, unspecified whether esophagitis present Assessment & Plan: No acid reflux reported.  On aciphex.       Einar Pheasant, MD

## 2022-06-20 ENCOUNTER — Other Ambulatory Visit: Payer: Self-pay

## 2022-06-20 ENCOUNTER — Encounter: Payer: Self-pay | Admitting: Internal Medicine

## 2022-06-20 DIAGNOSIS — D721 Eosinophilia, unspecified: Secondary | ICD-10-CM

## 2022-06-21 NOTE — Telephone Encounter (Signed)
Called and discussed.  Will repeat cbc as scheduled.

## 2022-06-23 ENCOUNTER — Encounter: Payer: Self-pay | Admitting: Internal Medicine

## 2022-06-23 NOTE — Assessment & Plan Note (Signed)
No acid reflux reported.  On aciphex.

## 2022-06-23 NOTE — Assessment & Plan Note (Signed)
Low cholesterol diet and exercise.  Follow lipid panel.   

## 2022-06-23 NOTE — Assessment & Plan Note (Addendum)
Planning for knee surgery as outlined.  Reports no chest pain or sob.  Previous EKG - SR with no acute ischemic changes.  Given currently without symptoms and EKG findings and recent surgery,  I feel that he is at low risk to proceed with the planned surgery.  He will need close intra op and post op monitoring of heart rate and blood pressure to avoid extremes.  Labs drawn.  Will review labs once available.

## 2022-06-27 ENCOUNTER — Other Ambulatory Visit (INDEPENDENT_AMBULATORY_CARE_PROVIDER_SITE_OTHER): Payer: BC Managed Care – PPO

## 2022-06-27 DIAGNOSIS — D721 Eosinophilia, unspecified: Secondary | ICD-10-CM

## 2022-06-27 LAB — CBC WITH DIFFERENTIAL/PLATELET
Basophils Absolute: 0 10*3/uL (ref 0.0–0.1)
Basophils Relative: 1 % (ref 0.0–3.0)
Eosinophils Absolute: 1.1 10*3/uL — ABNORMAL HIGH (ref 0.0–0.7)
Eosinophils Relative: 22.8 % — ABNORMAL HIGH (ref 0.0–5.0)
HCT: 43.3 % (ref 39.0–52.0)
Hemoglobin: 14.7 g/dL (ref 13.0–17.0)
Lymphocytes Relative: 21.3 % (ref 12.0–46.0)
Lymphs Abs: 1.1 10*3/uL (ref 0.7–4.0)
MCHC: 33.9 g/dL (ref 30.0–36.0)
MCV: 89.8 fl (ref 78.0–100.0)
Monocytes Absolute: 0.3 10*3/uL (ref 0.1–1.0)
Monocytes Relative: 6.7 % (ref 3.0–12.0)
Neutro Abs: 2.4 10*3/uL (ref 1.4–7.7)
Neutrophils Relative %: 48.2 % (ref 43.0–77.0)
Platelets: 175 10*3/uL (ref 150.0–400.0)
RBC: 4.81 Mil/uL (ref 4.22–5.81)
RDW: 13.2 % (ref 11.5–15.5)
WBC: 5 10*3/uL (ref 4.0–10.5)

## 2022-06-28 ENCOUNTER — Encounter: Payer: Self-pay | Admitting: Internal Medicine

## 2022-07-01 ENCOUNTER — Telehealth: Payer: Self-pay

## 2022-07-01 NOTE — Telephone Encounter (Signed)
Pre-op form, OV note and labs faxed to ortho

## 2022-11-10 ENCOUNTER — Encounter: Payer: Self-pay | Admitting: Internal Medicine

## 2022-11-15 ENCOUNTER — Other Ambulatory Visit: Payer: Self-pay | Admitting: Internal Medicine

## 2022-11-15 ENCOUNTER — Ambulatory Visit (INDEPENDENT_AMBULATORY_CARE_PROVIDER_SITE_OTHER): Payer: BC Managed Care – PPO | Admitting: Internal Medicine

## 2022-11-15 ENCOUNTER — Encounter: Payer: Self-pay | Admitting: Internal Medicine

## 2022-11-15 VITALS — BP 128/76 | HR 86 | Temp 97.9°F | Resp 16 | Ht 71.0 in | Wt 241.0 lb

## 2022-11-15 DIAGNOSIS — E7800 Pure hypercholesterolemia, unspecified: Secondary | ICD-10-CM

## 2022-11-15 DIAGNOSIS — K219 Gastro-esophageal reflux disease without esophagitis: Secondary | ICD-10-CM | POA: Diagnosis not present

## 2022-11-15 DIAGNOSIS — Z125 Encounter for screening for malignant neoplasm of prostate: Secondary | ICD-10-CM | POA: Diagnosis not present

## 2022-11-15 DIAGNOSIS — Z0000 Encounter for general adult medical examination without abnormal findings: Secondary | ICD-10-CM

## 2022-11-15 DIAGNOSIS — Z86010 Personal history of colonic polyps: Secondary | ICD-10-CM

## 2022-11-15 MED ORDER — FLUTICASONE PROPIONATE 50 MCG/ACT NA SUSP: NASAL | 3 refills | Status: DC

## 2022-11-15 MED ORDER — RABEPRAZOLE SODIUM 20 MG PO TBEC: 20.0000 mg | DELAYED_RELEASE_TABLET | Freq: Two times a day (BID) | ORAL | 1 refills | Status: DC

## 2022-11-15 NOTE — Assessment & Plan Note (Signed)
Physical today 11/14/22.   Colonoscopy 10/2019 - 6mm polyp transverse colon and internal hemorrhoids.  Check psa.

## 2022-11-15 NOTE — Progress Notes (Unsigned)
Subjective:    Patient ID: Victor Craig, male    DOB: Jan 25, 1976, 47 y.o.   MRN: [id number]  Patient here for  Chief Complaint  Patient presents with   Annual Exam    HPI Here for a physical exam.  S/p recent knee surgery.  Doing well.  No increased knee pain.  Staying active.  No chest pain or sob reported.  No abdominal pain or bowel change reported.  Does report increased acid reflux. Started after he started a new protein drink.  He is back on aciphex 20mg  bid now.  Still with some occasional break through - maybe once per week.  Overall feeling good.    Past Medical History:  Diagnosis Date   Allergy    GERD (gastroesophageal reflux disease)    Hiatal hernia    Hx of migraines    IBS (irritable bowel syndrome)    Pancreatitis    Serrated polyp of colon 06/2014   Triceps tendon rupture    right   Past Surgical History:  Procedure Laterality Date   COLONOSCOPY  06/21/2014   COLONOSCOPY  11/09/2019   LAPAROSCOPIC CHOLECYSTECTOMY     POLYPECTOMY     SPHINCTEROTOMY     TONSILLECTOMY     TRICEPS TENDON REPAIR Right 04/27/2015   Procedure: RIGHT DISTAL TRICEPS TENDON REPAIR;  Surgeon: Loreta Ave, MD;  Location: [facility];  Service: Orthopedics;  Laterality: Right;   tricept  reattached Left    ULNAR NERVE TRANSPOSITION Right 04/27/2015   Procedure: ULNAR NERVE DECOMPRESSION;  Surgeon: Loreta Ave, MD;  Location: [facility];  Service: Orthopedics;  Laterality: Right;   Family History  Problem Relation Age of Onset   Colon polyps Father    Prostate cancer Paternal Grandfather    Colon cancer Paternal Grandfather    Heart disease Maternal Grandfather    Hypertension Maternal Grandfather    Heart disease Mother    Hypertension Mother    Esophageal cancer Neg Hx    Rectal cancer Neg Hx    Stomach cancer Neg Hx    Social History   Socioeconomic History   Marital status: Married    Spouse name: Not on file   Number of  children: 2   Years of education: Not on file   Highest education level: Not on file  Occupational History   Occupation: Magazine features editor: [employer]  Tobacco Use   Smoking status: Never   Smokeless tobacco: Never  Vaping Use   Vaping Use: Never used  Substance and Sexual Activity   Alcohol use: No   Drug use: No   Sexual activity: Not on file  Other Topics Concern   Not on file  Social History Narrative   Not on file   Social Determinants of Health   Financial Resource Strain: Not on file  Food Insecurity: Not on file  Transportation Needs: Not on file  Physical Activity: Not on file  Stress: Not on file  Social Connections: Not on file     Review of Systems  Constitutional:  Negative for appetite change and unexpected weight change.  HENT:  Negative for congestion and sinus pressure.   Respiratory:  Negative for cough, chest tightness and shortness of breath.   Cardiovascular:  Negative for chest pain, palpitations and leg swelling.  Gastrointestinal:  Negative for abdominal pain, diarrhea, nausea and vomiting.       Acid reflux as outlined.   Genitourinary:  Negative for difficulty urinating and dysuria.  Musculoskeletal:  Negative for joint swelling and myalgias.  Skin:  Negative for color change and rash.  Neurological:  Negative for dizziness and headaches.  Psychiatric/Behavioral:  Negative for agitation and dysphoric mood.        Objective:     BP 128/76   Pulse 86   Temp 97.9 F (36.6 C)   Resp 16   Ht 5\' 11"  (1.803 m)   Wt 241 lb (109.3 kg)   SpO2 98%   BMI 33.61 kg/m  Wt Readings from Last 3 Encounters:  11/15/22 241 lb (109.3 kg)  06/19/22 243 lb 12.8 oz (110.6 kg)  03/13/22 245 lb 3.2 oz (111.2 kg)    Physical Exam Vitals reviewed.  Constitutional:      General: He is not in acute distress.    Appearance: Normal appearance. He is well-developed.  HENT:     Head: Normocephalic and atraumatic.     Right Ear:  External ear normal.     Left Ear: External ear normal.  Eyes:     General: No scleral icterus.       Right eye: No discharge.        Left eye: No discharge.     Conjunctiva/sclera: Conjunctivae normal.  Cardiovascular:     Rate and Rhythm: Normal rate and regular rhythm.  Pulmonary:     Effort: Pulmonary effort is normal. No respiratory distress.     Breath sounds: Normal breath sounds.  Abdominal:     General: Bowel sounds are normal.     Palpations: Abdomen is soft.     Tenderness: There is no abdominal tenderness.  Musculoskeletal:        General: No swelling or tenderness.     Cervical back: Neck supple. No tenderness.  Lymphadenopathy:     Cervical: No cervical adenopathy.  Skin:    Findings: No erythema or rash.  Neurological:     Mental Status: He is alert.  Psychiatric:        Mood and Affect: Mood normal.        Behavior: Behavior normal.      Outpatient Encounter Medications as of 11/15/2022  Medication Sig   FIBER PO Take by mouth.   fluticasone (FLONASE) 50 MCG/ACT nasal spray SPRAY 2 SPRAYS INTO EACH NOSTRIL EVERY DAY   Multiple Vitamin (MULTIVITAMIN ADULT PO) Take by mouth.   RABEprazole (ACIPHEX) 20 MG tablet Take 1 tablet (20 mg total) by mouth 2 (two) times daily before a meal.   [DISCONTINUED] fluticasone (FLONASE) 50 MCG/ACT nasal spray SPRAY 2 SPRAYS INTO EACH NOSTRIL EVERY DAY   [DISCONTINUED] RABEprazole (ACIPHEX) 20 MG tablet Take 1 tablet (20 mg total) by mouth daily.   No facility-administered encounter medications on file as of 11/15/2022.     Lab Results  Component Value Date   WBC 5.0 06/27/2022   HGB 14.7 06/27/2022   HCT 43.3 06/27/2022   PLT 175.0 06/27/2022   GLUCOSE 88 06/19/2022   CHOL 190 06/19/2022   TRIG 80.0 06/19/2022   HDL 33.70 (L) 06/19/2022   LDLCALC 140 (H) 06/19/2022   ALT 47 11/09/2021   AST 28 11/09/2021   NA 138 06/19/2022   K 4.3 06/19/2022   CL 103 06/19/2022   CREATININE 1.19 06/19/2022   BUN 19 06/19/2022    CO2 29 06/19/2022   TSH 2.04 11/09/2021   PSA 0.47 11/09/2021   INR 0.9 03/15/2012    No results found.  Assessment & Plan:  Hypercholesterolemia -     Basic metabolic panel; Future -     Hepatic function panel; Future -     Lipid panel; Future -     CBC with Differential/Platelet; Future -     TSH; Future  Prostate cancer screening -     PSA; Future  Health care maintenance Assessment & Plan: Physical today 11/14/22.   Colonoscopy 10/2019 - 6mm polyp transverse colon and internal hemorrhoids.  Check psa.     Other orders -     RABEprazole Sodium; Take 1 tablet (20 mg total) by mouth 2 (two) times daily before a meal.  Dispense: 180 tablet; Refill: 1 -     Fluticasone Propionate; SPRAY 2 SPRAYS INTO EACH NOSTRIL EVERY DAY  Dispense: 48 mL; Refill: 3     Dale [place], MD

## 2022-11-16 ENCOUNTER — Encounter: Payer: Self-pay | Admitting: Internal Medicine

## 2022-11-16 NOTE — Assessment & Plan Note (Signed)
Back on aciphex - bid. Still some occasional break through symptoms.  Discussed.  Add pepcid at night.  Follow.  Call with update.

## 2022-11-16 NOTE — Assessment & Plan Note (Signed)
Low cholesterol diet and exercise.  Follow lipid panel.   

## 2022-11-16 NOTE — Assessment & Plan Note (Signed)
Colonoscopy 2021 - One 6 mm polyp in the transverse colon.  Internal hemorrhoids. The examination was otherwise normal 

## 2022-12-20 ENCOUNTER — Other Ambulatory Visit: Payer: BC Managed Care – PPO

## 2022-12-20 DIAGNOSIS — E7800 Pure hypercholesterolemia, unspecified: Secondary | ICD-10-CM | POA: Diagnosis not present

## 2022-12-20 DIAGNOSIS — Z125 Encounter for screening for malignant neoplasm of prostate: Secondary | ICD-10-CM | POA: Diagnosis not present

## 2022-12-20 LAB — HEPATIC FUNCTION PANEL
ALT: 31 U/L (ref 0–53)
AST: 24 U/L (ref 0–37)
Albumin: 4.5 g/dL (ref 3.5–5.2)
Alkaline Phosphatase: 24 U/L — ABNORMAL LOW (ref 39–117)
Bilirubin, Direct: 0.1 mg/dL (ref 0.0–0.3)
Total Bilirubin: 0.6 mg/dL (ref 0.2–1.2)
Total Protein: 6.6 g/dL (ref 6.0–8.3)

## 2022-12-20 LAB — CBC WITH DIFFERENTIAL/PLATELET
Basophils Absolute: 0 10*3/uL (ref 0.0–0.1)
Basophils Relative: 1 % (ref 0.0–3.0)
Eosinophils Absolute: 0.3 10*3/uL (ref 0.0–0.7)
Eosinophils Relative: 6.6 % — ABNORMAL HIGH (ref 0.0–5.0)
HCT: 45.6 % (ref 39.0–52.0)
Hemoglobin: 15 g/dL (ref 13.0–17.0)
Lymphocytes Relative: 31.2 % (ref 12.0–46.0)
Lymphs Abs: 1.3 10*3/uL (ref 0.7–4.0)
MCHC: 33 g/dL (ref 30.0–36.0)
MCV: 88.8 fl (ref 78.0–100.0)
Monocytes Absolute: 0.4 10*3/uL (ref 0.1–1.0)
Monocytes Relative: 9.3 % (ref 3.0–12.0)
Neutro Abs: 2.2 10*3/uL (ref 1.4–7.7)
Neutrophils Relative %: 51.9 % (ref 43.0–77.0)
Platelets: 176 10*3/uL (ref 150.0–400.0)
RBC: 5.14 Mil/uL (ref 4.22–5.81)
RDW: 13.2 % (ref 11.5–15.5)
WBC: 4.2 10*3/uL (ref 4.0–10.5)

## 2022-12-20 LAB — PSA: PSA: 0.46 ng/mL (ref 0.10–4.00)

## 2022-12-20 LAB — BASIC METABOLIC PANEL
BUN: 24 mg/dL — ABNORMAL HIGH (ref 6–23)
CO2: 27 mEq/L (ref 19–32)
Calcium: 9.4 mg/dL (ref 8.4–10.5)
Chloride: 105 mEq/L (ref 96–112)
Creatinine, Ser: 1.19 mg/dL (ref 0.40–1.50)
GFR: 71.76 mL/min (ref >60.00)
Glucose, Bld: 83 mg/dL (ref 70–99)
Potassium: 4.5 mEq/L (ref 3.5–5.1)
Sodium: 138 mEq/L (ref 135–145)

## 2022-12-20 LAB — LIPID PANEL
Cholesterol: 183 mg/dL (ref 0–200)
HDL: 34.4 mg/dL — ABNORMAL LOW (ref >39.00)
LDL Cholesterol: 135 mg/dL — ABNORMAL HIGH (ref 0–99)
NonHDL: 148.31
Total CHOL/HDL Ratio: 5
Triglycerides: 65 mg/dL (ref 0.0–149.0)
VLDL: 13 mg/dL (ref 0.0–40.0)

## 2022-12-20 LAB — TSH: TSH: 1.86 u[IU]/mL (ref 0.35–5.50)

## 2023-03-19 ENCOUNTER — Encounter: Payer: Self-pay | Admitting: Internal Medicine

## 2023-03-19 NOTE — Telephone Encounter (Signed)
Patient returned office phone call. He is going to first try over the counter medication. If that does not work he will call and schedule an appointment.

## 2023-03-19 NOTE — Telephone Encounter (Signed)
Noted  

## 2023-03-19 NOTE — Telephone Encounter (Signed)
LMTCB. Needs an appt to discuss sinus issues.

## 2023-05-09 ENCOUNTER — Other Ambulatory Visit: Payer: Self-pay | Admitting: Internal Medicine

## 2023-05-23 ENCOUNTER — Ambulatory Visit: Payer: BC Managed Care – PPO | Admitting: Internal Medicine

## 2023-05-26 ENCOUNTER — Ambulatory Visit: Payer: BC Managed Care – PPO | Admitting: Internal Medicine

## 2023-06-20 ENCOUNTER — Other Ambulatory Visit: Payer: Self-pay | Admitting: Internal Medicine

## 2023-07-03 ENCOUNTER — Ambulatory Visit: Payer: BC Managed Care – PPO | Admitting: Internal Medicine

## 2023-09-09 ENCOUNTER — Telehealth: Payer: Self-pay

## 2023-09-09 NOTE — Telephone Encounter (Signed)
 Copied from CRM [id number]. Topic: General - Other >> Sep 09, 2023 12:40 PM Kita Perish H wrote: Reason for CRM: Patient wants to verify if he needs to fast for his appointment on 4/24 , please reach out to patient.  [place] [phone]

## 2023-09-09 NOTE — Telephone Encounter (Addendum)
 Advised for patient to fast for 4/24 appt.

## 2023-09-11 ENCOUNTER — Encounter: Payer: Self-pay | Admitting: Internal Medicine

## 2023-09-11 ENCOUNTER — Ambulatory Visit: Payer: BC Managed Care – PPO | Admitting: Internal Medicine

## 2023-09-11 VITALS — BP 118/72 | HR 79 | Temp 98.0°F | Resp 16 | Ht 71.0 in | Wt 237.0 lb

## 2023-09-11 DIAGNOSIS — Z23 Encounter for immunization: Secondary | ICD-10-CM | POA: Diagnosis not present

## 2023-09-11 DIAGNOSIS — K429 Umbilical hernia without obstruction or gangrene: Secondary | ICD-10-CM

## 2023-09-11 DIAGNOSIS — E7800 Pure hypercholesterolemia, unspecified: Secondary | ICD-10-CM | POA: Diagnosis not present

## 2023-09-11 DIAGNOSIS — Z860100 Personal history of colon polyps, unspecified: Secondary | ICD-10-CM | POA: Diagnosis not present

## 2023-09-11 DIAGNOSIS — K219 Gastro-esophageal reflux disease without esophagitis: Secondary | ICD-10-CM | POA: Diagnosis not present

## 2023-09-11 DIAGNOSIS — Z7185 Encounter for immunization safety counseling: Secondary | ICD-10-CM

## 2023-09-11 LAB — LIPID PANEL
Cholesterol: 167 mg/dL (ref 0–200)
HDL: 39 mg/dL — ABNORMAL LOW (ref >39.00)
LDL Cholesterol: 115 mg/dL — ABNORMAL HIGH (ref 0–99)
NonHDL: 127.68
Total CHOL/HDL Ratio: 4
Triglycerides: 65 mg/dL (ref 0.0–149.0)
VLDL: 13 mg/dL (ref 0.0–40.0)

## 2023-09-11 LAB — HEPATIC FUNCTION PANEL
ALT: 34 U/L (ref 0–53)
AST: 27 U/L (ref 0–37)
Albumin: 4.4 g/dL (ref 3.5–5.2)
Alkaline Phosphatase: 24 U/L — ABNORMAL LOW (ref 39–117)
Bilirubin, Direct: 0.1 mg/dL (ref 0.0–0.3)
Total Bilirubin: 0.6 mg/dL (ref 0.2–1.2)
Total Protein: 6.8 g/dL (ref 6.0–8.3)

## 2023-09-11 LAB — BASIC METABOLIC PANEL WITH GFR
BUN: 21 mg/dL (ref 6–23)
CO2: 28 meq/L (ref 19–32)
Calcium: 9.2 mg/dL (ref 8.4–10.5)
Chloride: 106 meq/L (ref 96–112)
Creatinine, Ser: 1.16 mg/dL (ref 0.40–1.50)
GFR: 73.62 mL/min (ref >60.00)
Glucose, Bld: 86 mg/dL (ref 70–99)
Potassium: 4.1 meq/L (ref 3.5–5.1)
Sodium: 139 meq/L (ref 135–145)

## 2023-09-11 MED ORDER — RABEPRAZOLE SODIUM 20 MG PO TBEC: 20.0000 mg | DELAYED_RELEASE_TABLET | Freq: Every day | ORAL | 1 refills | Status: DC

## 2023-09-11 MED ORDER — FLUTICASONE PROPIONATE 50 MCG/ACT NA SUSP: NASAL | 3 refills | Status: AC

## 2023-09-11 NOTE — Assessment & Plan Note (Signed)
Colonoscopy 2021 - One 6 mm polyp in the transverse colon.  Internal hemorrhoids. The examination was otherwise normal 

## 2023-09-11 NOTE — Assessment & Plan Note (Signed)
Prevnar 20 given today. 

## 2023-09-11 NOTE — Assessment & Plan Note (Signed)
 Umbilical hernia. Minimal tenderness to palpation. Will have surgery evaluate.

## 2023-09-11 NOTE — Addendum Note (Signed)
 Addended by: Victorino Grates D on: 09/11/2023 07:49 AM   Modules accepted: Orders

## 2023-09-11 NOTE — Progress Notes (Signed)
 Subjective:    Patient ID: Victor Craig, male    DOB: 10-Feb-1976, 48 y.o.   MRN: [id number]  Patient here for  Chief Complaint  Patient presents with   Medical Management of Chronic Issues    HPI Here for a scheduled follow up - follow up regarding GERD. Last visit, had started having some increased acid reflux after starting a new protein drink. Restarted aciphex . He is doing well on aciphex  once daily now. No acid reflux. No dysphagia. No chest pain or sob. Stays active. Has noticed umbilical hernia. Some minimal discomfort - notices when doing crunches or if leaning up against something.  Bowels moving.  Discussed immunizations.    Past Medical History:  Diagnosis Date   Allergy    GERD (gastroesophageal reflux disease)    Hiatal hernia    Hx of migraines    IBS (irritable bowel syndrome)    Pancreatitis    Serrated polyp of colon 06/2014   Triceps tendon rupture    right   Past Surgical History:  Procedure Laterality Date   COLONOSCOPY  06/21/2014   COLONOSCOPY  11/09/2019   LAPAROSCOPIC CHOLECYSTECTOMY     POLYPECTOMY     SPHINCTEROTOMY     TONSILLECTOMY     TRICEPS TENDON REPAIR Right 04/27/2015   Procedure: RIGHT DISTAL TRICEPS TENDON REPAIR;  Surgeon: Ferd Householder, MD;  Location: [facility];  Service: Orthopedics;  Laterality: Right;   tricept  reattached Left    ULNAR NERVE TRANSPOSITION Right 04/27/2015   Procedure: ULNAR NERVE DECOMPRESSION;  Surgeon: Ferd Householder, MD;  Location: [facility];  Service: Orthopedics;  Laterality: Right;   Family History  Problem Relation Age of Onset   Colon polyps Father    Prostate cancer Paternal Grandfather    Colon cancer Paternal Grandfather    Heart disease Maternal Grandfather    Hypertension Maternal Grandfather    Heart disease Mother    Hypertension Mother    Esophageal cancer Neg Hx    Rectal cancer Neg Hx    Stomach cancer Neg Hx    Social History   Socioeconomic  History   Marital status: Married    Spouse name: Not on file   Number of children: 2   Years of education: Not on file   Highest education level: Not on file  Occupational History   Occupation: Magazine features editor: [employer]  Tobacco Use   Smoking status: Never   Smokeless tobacco: Never  Vaping Use   Vaping status: Never Used  Substance and Sexual Activity   Alcohol use: No   Drug use: No   Sexual activity: Not on file  Other Topics Concern   Not on file  Social History Narrative   Not on file   Social Drivers of Health   Financial Resource Strain: Not on file  Food Insecurity: Not on file  Transportation Needs: Not on file  Physical Activity: Not on file  Stress: Not on file  Social Connections: Not on file     Review of Systems  Constitutional:  Negative for appetite change and unexpected weight change.  HENT:  Negative for congestion and sinus pressure.   Respiratory:  Negative for cough, chest tightness and shortness of breath.   Cardiovascular:  Negative for chest pain, palpitations and leg swelling.  Gastrointestinal:  Negative for abdominal pain, diarrhea, nausea and vomiting.  Genitourinary:  Negative for difficulty urinating and dysuria.  Musculoskeletal:  Negative for joint swelling and myalgias.  Skin:  Negative for color change and rash.  Neurological:  Negative for dizziness and headaches.  Psychiatric/Behavioral:  Negative for agitation and dysphoric mood.        Objective:     BP 118/72   Pulse 79   Temp 98 F (36.7 C)   Resp 16   Ht 5\' 11"  (1.803 m)   Wt 237 lb (107.5 kg)   SpO2 99%   BMI 33.05 kg/m  Wt Readings from Last 3 Encounters:  09/11/23 237 lb (107.5 kg)  11/15/22 241 lb (109.3 kg)  06/19/22 243 lb 12.8 oz (110.6 kg)    Physical Exam Vitals reviewed.  Constitutional:      General: He is not in acute distress.    Appearance: Normal appearance. He is well-developed.  HENT:     Head:  Normocephalic and atraumatic.     Right Ear: External ear normal.     Left Ear: External ear normal.     Mouth/Throat:     Pharynx: No oropharyngeal exudate or posterior oropharyngeal erythema.  Eyes:     General: No scleral icterus.       Right eye: No discharge.        Left eye: No discharge.     Conjunctiva/sclera: Conjunctivae normal.  Cardiovascular:     Rate and Rhythm: Normal rate and regular rhythm.  Pulmonary:     Effort: Pulmonary effort is normal. No respiratory distress.     Breath sounds: Normal breath sounds.  Abdominal:     General: Bowel sounds are normal.     Palpations: Abdomen is soft.     Tenderness: There is no abdominal tenderness.     Comments: Umbilical hernia - minimal tenderness to palpation.   Musculoskeletal:        General: No swelling or tenderness.     Cervical back: Neck supple. No tenderness.  Lymphadenopathy:     Cervical: No cervical adenopathy.  Skin:    Findings: No erythema or rash.  Neurological:     Mental Status: He is alert.  Psychiatric:        Mood and Affect: Mood normal.        Behavior: Behavior normal.         Outpatient Encounter Medications as of 09/11/2023  Medication Sig   FIBER PO Take by mouth.   fluticasone  (FLONASE ) 50 MCG/ACT nasal spray SPRAY 2 SPRAYS INTO EACH NOSTRIL EVERY DAY   Multiple Vitamin (MULTIVITAMIN ADULT PO) Take by mouth.   RABEprazole  (ACIPHEX ) 20 MG tablet Take 1 tablet (20 mg total) by mouth daily.   [DISCONTINUED] fluticasone  (FLONASE ) 50 MCG/ACT nasal spray SPRAY 2 SPRAYS INTO EACH NOSTRIL EVERY DAY   [DISCONTINUED] RABEprazole  (ACIPHEX ) 20 MG tablet TAKE 1 TABLET (20 MG TOTAL) BY MOUTH 2 (TWO) TIMES DAILY BEFORE A MEAL.   No facility-administered encounter medications on file as of 09/11/2023.     Lab Results  Component Value Date   WBC 4.2 12/20/2022   HGB 15.0 12/20/2022   HCT 45.6 12/20/2022   PLT 176.0 12/20/2022   GLUCOSE 83 12/20/2022   CHOL 183 12/20/2022   TRIG 65.0 12/20/2022    HDL 34.40 (L) 12/20/2022   LDLCALC 135 (H) 12/20/2022   ALT 31 12/20/2022   AST 24 12/20/2022   NA 138 12/20/2022   K 4.5 12/20/2022   CL 105 12/20/2022   CREATININE 1.19 12/20/2022   BUN 24 (H) 12/20/2022   CO2 27 12/20/2022  TSH 1.86 12/20/2022   PSA 0.46 12/20/2022   INR 0.9 03/15/2012    No results found.     Assessment & Plan:  Umbilical hernia without obstruction and without gangrene Assessment & Plan: Umbilical hernia. Minimal tenderness to palpation. Will have surgery evaluate.   Orders: -     Ambulatory referral to General Surgery  Hypercholesterolemia Assessment & Plan: Low cholesterol diet and exercise. Follow lipid panel. Check lipid panel today.   Orders: -     Lipid panel -     Hepatic function panel -     Basic metabolic panel with GFR  Gastroesophageal reflux disease, unspecified whether esophagitis present Assessment & Plan: On aciphex  daily. Symptoms controlled. No break through symptoms. Follow    History of colonic polyps Assessment & Plan: Colonoscopy 2021 - One 6 mm polyp in the transverse colon.  Internal hemorrhoids. The examination was otherwise normal   Immunization counseling Assessment & Plan: Prevnar 20 given today.    Other orders -     RABEprazole  Sodium; Take 1 tablet (20 mg total) by mouth daily.  Dispense: 90 tablet; Refill: 1 -     Fluticasone  Propionate; SPRAY 2 SPRAYS INTO EACH NOSTRIL EVERY DAY  Dispense: 48 mL; Refill: 3     Dellar Fenton, MD

## 2023-09-11 NOTE — Assessment & Plan Note (Signed)
 On aciphex  daily. Symptoms controlled. No break through symptoms. Follow

## 2023-09-11 NOTE — Assessment & Plan Note (Signed)
Low cholesterol diet and exercise.  Follow lipid panel  Check lipid panel today.  

## 2024-01-12 ENCOUNTER — Encounter: Payer: Self-pay | Admitting: Internal Medicine

## 2024-01-13 NOTE — Telephone Encounter (Signed)
 I called and spoke with patient. He needs to do late afternoon or early AM appointment. This has been going on for several weeks and is unchanged. Confirmed no trouble urinating. No warmth, swelling, redness, tenderness to the area. He is still able to do normal daily activities with out problems. The first 4pm available is 9/9. He was okay with this if you are. Are you okay with this or would you like to work him in sooner?

## 2024-01-13 NOTE — Telephone Encounter (Signed)
 Copied from CRM [id number]. Topic: Appointments - Scheduling Inquiry for Clinic >> Jan 13, 2024  4:23 PM [name] wrote: Reason for CRM: Patient is wondering if there was an update with fitting him into Dr Freda schedule sooner than the 9th of September. He did state that for anything that is around 4:30, please ensure it is on a Monday, Tuesday, or Wednesday.

## 2024-01-13 NOTE — Telephone Encounter (Signed)
 Please hold for earlier work in appt. Any change or worsening symptoms, needs to be evaluated.

## 2024-01-13 NOTE — Telephone Encounter (Signed)
 Copied from CRM [id number]. Topic: Appointments - Scheduling Inquiry for Clinic >> Jan 13, 2024  4:23 PM [name] wrote: Reason for CRM: Patient is wondering if there was an update with fitting him into Dr Freda schedule sooner than the 9th of September. He did state that for anything that is around 4:30, please ensure it is on a Monday, Tuesday, or Wednesday.  Dr. Glendia has a 4pm virtual spot open on 01/15/2024.  I held it in case she would like to use this for patient in-office.

## 2024-01-14 NOTE — Telephone Encounter (Signed)
 Noted

## 2024-01-14 NOTE — Telephone Encounter (Signed)
 Patient called back and I scheduled an appointment for him to see Dr. Glendia on 01/15/2024 at 4pm.

## 2024-01-14 NOTE — Telephone Encounter (Signed)
 LM for patient. Please schedule patient in 8/28 4pm spot with Dr Glendia if patient is available to come in.

## 2024-01-14 NOTE — Telephone Encounter (Signed)
 I left a voicemail for patient letting him know that Dr. Allena Hamilton would like to see him tomorrow (01/15/2024) at 4pm if he is able to come in.  I asked him to please call us  back and let us  know.  I also sent a message to patient via MyChart.  E2C2 - when patient calls back, if he is able to come in tomorrow at 4pm to see Dr. Allena Hamilton, please transfer call to our office for scheduling and ask for Luke or Darice.

## 2024-01-15 ENCOUNTER — Ambulatory Visit: Admitting: Internal Medicine

## 2024-01-15 VITALS — BP 130/70 | HR 80 | Resp 16 | Ht 71.0 in | Wt 240.0 lb

## 2024-01-15 DIAGNOSIS — H938X2 Other specified disorders of left ear: Secondary | ICD-10-CM

## 2024-01-15 DIAGNOSIS — M549 Dorsalgia, unspecified: Secondary | ICD-10-CM | POA: Diagnosis not present

## 2024-01-15 DIAGNOSIS — N50812 Left testicular pain: Secondary | ICD-10-CM | POA: Diagnosis not present

## 2024-01-15 NOTE — Progress Notes (Signed)
 Subjective:    Patient ID: Victor Craig, male    DOB: 08-21-1975, 48 y.o.   MRN: [id number]  Patient here for work in appt.    HPI Here for work in appt - increased pain - groin and lower back and left testicle. Recently evaluated for umbilical hernia. Planning for repair. Reports that over the last few weeks, he has noticed that his left testicle is uncomfortable. Notices discomfort in his left groin as well. Some discomfort in his left lower back with the discomfort shooting through to his groin/testicle. No abdominal pain. No vomiting. Urinating ok. Bowels moving. No blood in urine. No injury. Also reports noticing some left ear fullness. No increased pain.    Past Medical History:  Diagnosis Date   Allergy    GERD (gastroesophageal reflux disease)    Hiatal hernia    Hx of migraines    IBS (irritable bowel syndrome)    Pancreatitis    Serrated polyp of colon 06/2014   Triceps tendon rupture    right   Past Surgical History:  Procedure Laterality Date   COLONOSCOPY  06/21/2014   COLONOSCOPY  11/09/2019   LAPAROSCOPIC CHOLECYSTECTOMY     POLYPECTOMY     SPHINCTEROTOMY     TONSILLECTOMY     TRICEPS TENDON REPAIR Right 04/27/2015   Procedure: RIGHT DISTAL TRICEPS TENDON REPAIR;  Surgeon: Toribio JULIANNA Chancy, MD;  Location: [facility];  Service: Orthopedics;  Laterality: Right;   tricept  reattached Left    ULNAR NERVE TRANSPOSITION Right 04/27/2015   Procedure: ULNAR NERVE DECOMPRESSION;  Surgeon: Toribio JULIANNA Chancy, MD;  Location:  SURGERY CENTER;  Service: Orthopedics;  Laterality: Right;   Family History  Problem Relation Age of Onset   Colon polyps Father    Prostate cancer Paternal Grandfather    Colon cancer Paternal Grandfather    Heart disease Maternal Grandfather    Hypertension Maternal Grandfather    Heart disease Mother    Hypertension Mother    Esophageal cancer Neg Hx    Rectal cancer Neg Hx    Stomach cancer Neg Hx    Social  History   Socioeconomic History   Marital status: Married    Spouse name: Not on file   Number of children: 2   Years of education: Not on file   Highest education level: Not on file  Occupational History   Occupation: Magazine features editor: [employer]  Tobacco Use   Smoking status: Never   Smokeless tobacco: Never  Vaping Use   Vaping status: Never Used  Substance and Sexual Activity   Alcohol use: No   Drug use: No   Sexual activity: Not on file  Other Topics Concern   Not on file  Social History Narrative   Not on file   Social Drivers of Health   Financial Resource Strain: Low Risk  (09/23/2023)   Received from [facility]   Overall Financial Resource Strain (CARDIA)    Difficulty of Paying Living Expenses: Not very hard  Food Insecurity: No Food Insecurity (09/23/2023)   Received from [facility]   Hunger Vital Sign    Within the past 12 months, you worried that your food would run out before you got the money to buy more.: Never true    Within the past 12 months, the food you bought just didn't last and you didn't have money to get more.: Never true  Transportation  Needs: No Transportation Needs (09/23/2023)   Received from [facility] - Transportation    In the past 12 months, has lack of transportation kept you from medical appointments or from getting medications?: No    Lack of Transportation (Non-Medical): No  Physical Activity: Not on file  Stress: Not on file  Social Connections: Not on file     Review of Systems  Constitutional:  Negative for appetite change, fever and unexpected weight change.  HENT:  Negative for congestion and sinus pressure.        Left ear fullness.   Respiratory:  Negative for cough, chest tightness and shortness of breath.   Cardiovascular:  Negative for chest pain, palpitations and leg swelling.  Gastrointestinal:  Negative for abdominal pain,  nausea and vomiting.  Genitourinary:  Negative for difficulty urinating and dysuria.       Discomfort - left testicle.   Musculoskeletal:  Negative for joint swelling and myalgias.  Skin:  Negative for color change and rash.  Neurological:  Negative for dizziness and headaches.  Psychiatric/Behavioral:  Negative for agitation and dysphoric mood.        Objective:     BP 130/70   Pulse 80   Resp 16   Ht 5' 11 (1.803 m)   Wt 240 lb (108.9 kg)   SpO2 98%   BMI 33.47 kg/m  Wt Readings from Last 3 Encounters:  01/15/24 240 lb (108.9 kg)  09/11/23 237 lb (107.5 kg)  11/15/22 241 lb (109.3 kg)    Physical Exam Vitals reviewed.  Constitutional:      General: He is not in acute distress.    Appearance: Normal appearance. He is well-developed.  HENT:     Head: Normocephalic and atraumatic.     Right Ear: External ear normal.     Left Ear: External ear normal.     Ears:     Comments: No cerumen impaction. No increased erythema - left TM.  Eyes:     General: No scleral icterus.       Right eye: No discharge.        Left eye: No discharge.     Conjunctiva/sclera: Conjunctivae normal.  Cardiovascular:     Rate and Rhythm: Normal rate and regular rhythm.  Pulmonary:     Effort: Pulmonary effort is normal. No respiratory distress.     Breath sounds: Normal breath sounds.  Abdominal:     General: Bowel sounds are normal.     Palpations: Abdomen is soft.     Tenderness: There is no abdominal tenderness.  Genitourinary:    Comments: No pain to palpation - left testicle. No nodule appreciated.  No lymphadenopathy. Could not appreciate hernia to be present.  Musculoskeletal:        General: No swelling or tenderness.     Cervical back: Neck supple. No tenderness.     Comments: No CVA tenderness.   Lymphadenopathy:     Cervical: No cervical adenopathy.  Skin:    Findings: No erythema or rash.  Neurological:     Mental Status: He is alert.  Psychiatric:        Mood and  Affect: Mood normal.        Behavior: Behavior normal.         Outpatient Encounter Medications as of 01/15/2024  Medication Sig   FIBER PO Take by mouth.   fluticasone  (FLONASE ) 50 MCG/ACT nasal spray SPRAY 2 SPRAYS INTO EACH NOSTRIL EVERY DAY  Multiple Vitamin (MULTIVITAMIN ADULT PO) Take by mouth.   RABEprazole  (ACIPHEX ) 20 MG tablet Take 1 tablet (20 mg total) by mouth daily.   No facility-administered encounter medications on file as of 01/15/2024.     Lab Results  Component Value Date   WBC 4.2 12/20/2022   HGB 15.0 12/20/2022   HCT 45.6 12/20/2022   PLT 176.0 12/20/2022   GLUCOSE 86 09/11/2023   CHOL 167 09/11/2023   TRIG 65.0 09/11/2023   HDL 39.00 (L) 09/11/2023   LDLCALC 115 (H) 09/11/2023   ALT 34 09/11/2023   AST 27 09/11/2023   NA 139 09/11/2023   K 4.1 09/11/2023   CL 106 09/11/2023   CREATININE 1.16 09/11/2023   BUN 21 09/11/2023   CO2 28 09/11/2023   TSH 1.86 12/20/2022   PSA 0.46 12/20/2022   INR 0.9 03/15/2012       Assessment & Plan:  Pain in left testicle Assessment & Plan: Has noticed discomfort - left testicle. Some left groin discomfort. Pain - left lower back. No urine change. No bowel change. No abdominal pain. Could not appreciate hernia on exam. Unclear etiology. Question of kidney stone. Check urine. May need xray - KUB. No pain to palpation of the testicle. Discuss with urology. Urology evaluation.    Back pain, unspecified back location, unspecified back pain laterality, unspecified chronicity -     Urinalysis, Routine w reflex microscopic; Future -     Urine Culture; Future  Ear fullness, left Assessment & Plan: No cerumen impaction. No pain. Fullness. No erythema. Will have ENT evaluate.   Orders: -     Ambulatory referral to ENT     Allena Hamilton, MD

## 2024-01-16 ENCOUNTER — Encounter: Payer: Self-pay | Admitting: Internal Medicine

## 2024-01-16 ENCOUNTER — Ambulatory Visit: Payer: Self-pay | Admitting: Internal Medicine

## 2024-01-16 LAB — URINALYSIS, ROUTINE W REFLEX MICROSCOPIC
Bilirubin Urine: NEGATIVE
Hgb urine dipstick: NEGATIVE
Ketones, ur: NEGATIVE
Leukocytes,Ua: NEGATIVE
Nitrite: NEGATIVE
RBC / HPF: NONE SEEN (ref 0–?)
Specific Gravity, Urine: 1.025 (ref 1.000–1.030)
Total Protein, Urine: NEGATIVE
Urine Glucose: NEGATIVE
Urobilinogen, UA: 0.2 (ref 0.0–1.0)
WBC, UA: NONE SEEN (ref 0–?)
pH: 5.5 (ref 5.0–8.0)

## 2024-01-16 LAB — URINE CULTURE
MICRO NUMBER:: 16896659
Result:: NO GROWTH
SPECIMEN QUALITY:: ADEQUATE

## 2024-01-16 NOTE — Telephone Encounter (Signed)
Pt returned call to go over results.

## 2024-01-16 NOTE — Telephone Encounter (Unsigned)
 Copied from CRM [id number]. Topic: Clinical - Lab/Test Results >> Jan 16, 2024  1:44 PM Martinique E wrote: Reason for CRM: Patient was returning a call to go over his urinalysis results. Callback number [phone].

## 2024-01-18 ENCOUNTER — Telehealth: Payer: Self-pay | Admitting: *Deleted

## 2024-01-18 NOTE — Telephone Encounter (Signed)
 Copied from CRM [id number]. Topic: General - Other >> Jan 16, 2024  4:58 PM Roselie BROCKS wrote: Reason for CRM: Patient wants the clinic to call his wife , Levaughn Puccinelli [phone] concerning patient   Routing to Dr Glendia who (per note) called pt on 8/29

## 2024-01-20 ENCOUNTER — Encounter: Payer: Self-pay | Admitting: Urology

## 2024-01-20 ENCOUNTER — Ambulatory Visit
Admission: RE | Admit: 2024-01-20 | Discharge: 2024-01-20 | Disposition: A | Discharge status: Home / Self Care | Source: Ambulatory Visit | Attending: Urology | Admitting: Urology

## 2024-01-20 ENCOUNTER — Ambulatory Visit: Admitting: Urology

## 2024-01-20 ENCOUNTER — Encounter: Payer: Self-pay | Admitting: Internal Medicine

## 2024-01-20 ENCOUNTER — Telehealth: Payer: Self-pay | Admitting: Urology

## 2024-01-20 VITALS — BP 136/85 | HR 99 | Ht 71.0 in | Wt 240.0 lb

## 2024-01-20 DIAGNOSIS — R1032 Left lower quadrant pain: Secondary | ICD-10-CM | POA: Diagnosis present

## 2024-01-20 DIAGNOSIS — N50812 Left testicular pain: Secondary | ICD-10-CM | POA: Insufficient documentation

## 2024-01-20 DIAGNOSIS — H938X2 Other specified disorders of left ear: Secondary | ICD-10-CM | POA: Insufficient documentation

## 2024-01-20 LAB — MICROSCOPIC EXAMINATION
Bacteria, UA: NONE SEEN
WBC, UA: NONE SEEN /HPF (ref 0–5)

## 2024-01-20 LAB — URINALYSIS, COMPLETE
Bilirubin, UA: NEGATIVE
Glucose, UA: NEGATIVE
Ketones, UA: NEGATIVE
Leukocytes,UA: NEGATIVE
Nitrite, UA: NEGATIVE
Protein,UA: NEGATIVE
Specific Gravity, UA: 1.025 (ref 1.005–1.030)
Urobilinogen, Ur: 0.2 mg/dL (ref 0.2–1.0)
pH, UA: 6 (ref 5.0–7.5)

## 2024-01-20 NOTE — Assessment & Plan Note (Signed)
 No cerumen impaction. No pain. Fullness. No erythema. Will have ENT evaluate.

## 2024-01-20 NOTE — Assessment & Plan Note (Signed)
 Has noticed discomfort - left testicle. Some left groin discomfort. Pain - left lower back. No urine change. No bowel change. No abdominal pain. Could not appreciate hernia on exam. Unclear etiology. Question of kidney stone. Check urine. May need xray - KUB. No pain to palpation of the testicle. Discuss with urology. Urology evaluation.

## 2024-01-20 NOTE — Telephone Encounter (Signed)
 Had left message with Erminio. Called Victor Craig back and spoke to him regarding urology appt. He is agreeable. Shannon notified. They are going to reach out to schedule.

## 2024-01-20 NOTE — Telephone Encounter (Signed)
 Please let Arvella know that his x-ray did not show any kidney stones.  I recommend having a CAT scan for further evaluation to see if we can discover a cause for his symptoms.  If he is agreeable, please let me know and I will place orders.

## 2024-01-20 NOTE — Progress Notes (Signed)
 01/20/2024 5:47 AM   Victor Craig 02-02-1976 [id number]  Referring provider: Glendia Shad, MD [address] [place],  [place] [address]  Urological history: 1. None    Chief Complaint  Patient presents with   Groin Pain    HPI: Victor Craig is a 48 y.o. man who presents today for left groin discomfort.    Previous records reviewed.   For the last 3 weeks, he has been experiencing left lower back pain with radiation into the left inguinal area and down into the left testicle.    He describes the pain more as a discomfort.  The pain is not worsened when he bends over and touches his toes, picks his left leg up or stretches his left leg back.  He is not having any painful intercourse, painful ejaculation or blood in his semen.  Patient denies any modifying or aggravating factors.  Patient denies any recent UTI's, gross hematuria, dysuria or suprapubic/flank pain.  Patient denies any fevers, chills, nausea or vomiting.    He denies any trauma to the testicles or swelling in the testicles.     He denies any history of nephrolithiasis.  He denies any family history of nephrolithiasis.  UA clear  KUB no stones seen.    He is a non-smoker, does not consume alcohol or use illicit drugs.  He was referred to urology in 2018 for similar issues, but he wasn't seen.     Lab Results  Component Value Date   CREATININE 1.16 09/11/2023    PMH: Past Medical History:  Diagnosis Date   Allergy    GERD (gastroesophageal reflux disease)    Hiatal hernia    Hx of migraines    IBS (irritable bowel syndrome)    Pancreatitis    Serrated polyp of colon 06/2014   Triceps tendon rupture    right    Surgical History: Past Surgical History:  Procedure Laterality Date   COLONOSCOPY  06/21/2014   COLONOSCOPY  11/09/2019   LAPAROSCOPIC CHOLECYSTECTOMY     POLYPECTOMY     SPHINCTEROTOMY     TONSILLECTOMY     TRICEPS TENDON REPAIR Right 04/27/2015    Procedure: RIGHT DISTAL TRICEPS TENDON REPAIR;  Surgeon: Toribio JULIANNA Chancy, MD;  Location: [facility];  Service: Orthopedics;  Laterality: Right;   tricept  reattached Left    ULNAR NERVE TRANSPOSITION Right 04/27/2015   Procedure: ULNAR NERVE DECOMPRESSION;  Surgeon: Toribio JULIANNA Chancy, MD;  Location: [facility];  Service: Orthopedics;  Laterality: Right;    Home Medications:  Allergies as of 01/20/2024       Reactions   Sulfasalazine Hives   REACTION: hives REACTION: hives   Sulfonamide Derivatives    REACTION: hives        Medication List        Accurate as of January 20, 2024 11:59 PM. If you have any questions, ask your nurse or doctor.          FIBER PO Take by mouth.   fluticasone  50 MCG/ACT nasal spray Commonly known as: FLONASE  SPRAY 2 SPRAYS INTO EACH NOSTRIL EVERY DAY   MULTIVITAMIN ADULT PO Take by mouth.   RABEprazole  20 MG tablet Commonly known as: ACIPHEX  Take 1 tablet (20 mg total) by mouth daily.        Allergies:  Allergies  Allergen Reactions   Sulfasalazine Hives    REACTION: hives REACTION: hives    Sulfonamide Derivatives  REACTION: hives    Family History: Family History  Problem Relation Age of Onset   Colon polyps Father    Prostate cancer Paternal Grandfather    Colon cancer Paternal Grandfather    Heart disease Maternal Grandfather    Hypertension Maternal Grandfather    Heart disease Mother    Hypertension Mother    Esophageal cancer Neg Hx    Rectal cancer Neg Hx    Stomach cancer Neg Hx     Social History: See HPI for pertinent social history  ROS: Pertinent ROS in HPI  Physical Exam: BP 136/85 (BP Location: Left Arm, Patient Position: Sitting, Cuff Size: Large)   Pulse 99   Ht 5' 11 (1.803 m)   Wt 240 lb (108.9 kg)   BMI 33.47 kg/m   Constitutional:  Well nourished. Alert and oriented, No acute distress. HEENT: [place] AT, moist mucus membranes.  Trachea midline Cardiovascular:  No clubbing, cyanosis, or edema. Respiratory: Normal respiratory effort, no increased work of breathing. GU: No CVA tenderness.  No bladder fullness or masses.  Patient with circumcised phallus.  Urethral meatus is patent.  No penile discharge. No penile lesions or rashes. Scrotum without lesions, cysts, rashes and/or edema.  Testicles are located scrotally bilaterally. No masses are appreciated in the testicles. Left and right epididymis are normal.  No hernias appreciated.   Neurologic: Grossly intact, no focal deficits, moving all 4 extremities. Psychiatric: Normal mood and affect.  Laboratory Data: See EPIC and HPI  I have reviewed the labs.   Pertinent Imaging: KUB I have independently reviewed the films.  See HPI.  Radiologist interpretation still pending.   Assessment & Plan:    1. Groin discomfort, left (Primary) - Urinalysis, Complete, Clear - KUB did not identify any stones - no evidence of inguinal hernia, testicular pathology or urinary source on exam - may be secondary to MSK etiology vs referred pain - patient would like to proceed with CT for further evaluation  - CT renal scan ordered   Return for I will call patient with results.  These notes generated with voice recognition software. I apologize for typographical errors.  Victor Craig  [facility] [address]  [address] [place], [place] [address] [phone]

## 2024-01-21 NOTE — Telephone Encounter (Signed)
 Called and spoke to Raymond. Discussed labs. Discussed referral to urology. Agreeable. Urology to call him with appt date. Spoke to [facility].

## 2024-01-22 ENCOUNTER — Other Ambulatory Visit: Payer: Self-pay | Admitting: Urology

## 2024-01-22 ENCOUNTER — Telehealth: Payer: Self-pay | Admitting: Urology

## 2024-01-22 DIAGNOSIS — R1032 Left lower quadrant pain: Secondary | ICD-10-CM

## 2024-01-22 NOTE — Telephone Encounter (Signed)
 Would you let Mr. Victor Craig know that I put the orders in for the CT scan?  The folks at the scheduling department will call him and get that appointment for him.  I will then receive the results in 1 to 2 weeks and will reach out to him with the results.  If he has not heard from scheduling department by next week, please have him call [phone] to get his CT scheduled.

## 2024-02-04 ENCOUNTER — Ambulatory Visit
Admission: RE | Admit: 2024-02-04 | Discharge: 2024-02-04 | Disposition: A | Discharge status: Home / Self Care | Source: Ambulatory Visit | Attending: Urology | Admitting: Urology

## 2024-02-04 DIAGNOSIS — R1032 Left lower quadrant pain: Secondary | ICD-10-CM | POA: Diagnosis present

## 2024-02-05 ENCOUNTER — Ambulatory Visit: Payer: Self-pay | Admitting: Urology

## 2024-03-14 ENCOUNTER — Other Ambulatory Visit: Payer: Self-pay | Admitting: Internal Medicine

## 2024-03-16 ENCOUNTER — Encounter: Payer: Self-pay | Admitting: Internal Medicine

## 2024-03-18 ENCOUNTER — Ambulatory Visit (INDEPENDENT_AMBULATORY_CARE_PROVIDER_SITE_OTHER): Admitting: Internal Medicine

## 2024-03-18 ENCOUNTER — Encounter: Payer: Self-pay | Admitting: Internal Medicine

## 2024-03-18 VITALS — BP 110/70 | HR 77 | Temp 97.7°F | Ht 71.5 in | Wt 244.5 lb

## 2024-03-18 DIAGNOSIS — M545 Low back pain, unspecified: Secondary | ICD-10-CM

## 2024-03-18 DIAGNOSIS — Z0000 Encounter for general adult medical examination without abnormal findings: Secondary | ICD-10-CM | POA: Diagnosis not present

## 2024-03-18 DIAGNOSIS — Z860100 Personal history of colon polyps, unspecified: Secondary | ICD-10-CM

## 2024-03-18 DIAGNOSIS — E7800 Pure hypercholesterolemia, unspecified: Secondary | ICD-10-CM | POA: Diagnosis not present

## 2024-03-18 DIAGNOSIS — Z125 Encounter for screening for malignant neoplasm of prostate: Secondary | ICD-10-CM

## 2024-03-18 DIAGNOSIS — K429 Umbilical hernia without obstruction or gangrene: Secondary | ICD-10-CM

## 2024-03-18 DIAGNOSIS — K219 Gastro-esophageal reflux disease without esophagitis: Secondary | ICD-10-CM

## 2024-03-18 MED ORDER — RABEPRAZOLE SODIUM 20 MG PO TBEC: 20.0000 mg | DELAYED_RELEASE_TABLET | Freq: Every day | ORAL | 1 refills | Status: AC

## 2024-03-18 NOTE — Assessment & Plan Note (Addendum)
 Physical today 03/18/24.   Colonoscopy 10/2019 - 6mm polyp transverse colon and internal hemorrhoids.  Recommended f/u in 5 years. Check psa with next labs.

## 2024-03-18 NOTE — Assessment & Plan Note (Signed)
Colonoscopy 2021 - One 6 mm polyp in the transverse colon.  Internal hemorrhoids. The examination was otherwise normal 

## 2024-03-18 NOTE — Assessment & Plan Note (Signed)
 Low back pain. Worse after sitting for a while. Better once up and moving. Discussed PT. Wants to hold notify me if increased pain or change in symptoms.

## 2024-03-18 NOTE — Assessment & Plan Note (Signed)
 Controlled with aciphex .

## 2024-03-18 NOTE — Progress Notes (Signed)
 Subjective:    Patient ID: Victor Craig, male    DOB: 1975/09/17, 48 y.o.   MRN: [id number]  Patient here for  Chief Complaint  Patient presents with   Annual Exam    CPE    HPI Here for a physical exam. Was having issues with left low back and groin pain and left testicular pain. Saw urology. Saw urology. CT - no evidence of urolithiasis, hydronephrosis or other acute finding. Tiny fat containing umbilical hernia. Has seen Dr Cesar for evaluation of umbilical hernia. Elected to hold on surgery at this tie. Also with history of reflux. On aciphex . Controlling symptoms. No groin or testicular symptoms. Minimal low back pain- after sitting for a while. Better once up walking. No radicular symptoms. No bowel change. No urine change.    Past Medical History:  Diagnosis Date   Allergy    GERD (gastroesophageal reflux disease)    Hiatal hernia    Hx of migraines    IBS (irritable bowel syndrome)    Pancreatitis    Serrated polyp of colon 06/2014   Triceps tendon rupture    right   Past Surgical History:  Procedure Laterality Date   COLONOSCOPY  06/21/2014   COLONOSCOPY  11/09/2019   LAPAROSCOPIC CHOLECYSTECTOMY     POLYPECTOMY     SPHINCTEROTOMY     TONSILLECTOMY     TRICEPS TENDON REPAIR Right 04/27/2015   Procedure: RIGHT DISTAL TRICEPS TENDON REPAIR;  Surgeon: Toribio JULIANNA Chancy, MD;  Location: [facility];  Service: Orthopedics;  Laterality: Right;   tricept  reattached Left    ULNAR NERVE TRANSPOSITION Right 04/27/2015   Procedure: ULNAR NERVE DECOMPRESSION;  Surgeon: Toribio JULIANNA Chancy, MD;  Location: [facility];  Service: Orthopedics;  Laterality: Right;   Family History  Problem Relation Age of Onset   Colon polyps Father    Prostate cancer Paternal Grandfather    Colon cancer Paternal Grandfather    Heart disease Maternal Grandfather    Hypertension Maternal Grandfather    Heart disease Mother    Hypertension Mother    Esophageal cancer  Neg Hx    Rectal cancer Neg Hx    Stomach cancer Neg Hx    Social History   Socioeconomic History   Marital status: Married    Spouse name: Not on file   Number of children: 2   Years of education: Not on file   Highest education level: Not on file  Occupational History   Occupation: Magazine Features Editor: [employer]  Tobacco Use   Smoking status: Never   Smokeless tobacco: Never  Vaping Use   Vaping status: Never Used  Substance and Sexual Activity   Alcohol use: No   Drug use: No   Sexual activity: Not on file  Other Topics Concern   Not on file  Social History Narrative   Not on file   Social Drivers of Health   Financial Resource Strain: Low Risk  (09/23/2023)   Received from [facility]   Overall Financial Resource Strain (CARDIA)    Difficulty of Paying Living Expenses: Not very hard  Food Insecurity: No Food Insecurity (09/23/2023)   Received from [facility]   Hunger Vital Sign    Within the past 12 months, you worried that your food would run out before you got the money to buy more.: Never true    Within the past 12 months, the food you  bought just didn't last and you didn't have money to get more.: Never true  Transportation Needs: No Transportation Needs (09/23/2023)   Received from [facility] - Transportation    In the past 12 months, has lack of transportation kept you from medical appointments or from getting medications?: No    Lack of Transportation (Non-Medical): No  Physical Activity: Not on file  Stress: Not on file  Social Connections: Not on file     Review of Systems  Constitutional:  Negative for appetite change and unexpected weight change.  HENT:  Negative for congestion, sinus pressure and sore throat.   Eyes:  Negative for pain and visual disturbance.  Respiratory:  Negative for cough, chest tightness and shortness of breath.   Cardiovascular:  Negative  for chest pain, palpitations and leg swelling.  Gastrointestinal:  Negative for abdominal pain, diarrhea, nausea and vomiting.  Genitourinary:  Negative for difficulty urinating and dysuria.  Musculoskeletal:  Positive for back pain. Negative for joint swelling and myalgias.  Skin:  Negative for color change and rash.  Neurological:  Negative for dizziness and headaches.  Hematological:  Negative for adenopathy. Does not bruise/bleed easily.  Psychiatric/Behavioral:  Negative for agitation and dysphoric mood.        Objective:     BP 110/70 (Cuff Size: Large)   Pulse 77   Temp 97.7 F (36.5 C) (Oral)   Ht 5' 11.5 (1.816 m)   Wt 244 lb 8 oz (110.9 kg)   SpO2 97%   BMI 33.63 kg/m  Wt Readings from Last 3 Encounters:  03/18/24 244 lb 8 oz (110.9 kg)  01/20/24 240 lb (108.9 kg)  01/15/24 240 lb (108.9 kg)    Physical Exam Constitutional:      General: He is not in acute distress.    Appearance: Normal appearance. He is well-developed.  HENT:     Head: Normocephalic and atraumatic.     Right Ear: External ear normal.     Left Ear: External ear normal.     Mouth/Throat:     Pharynx: No oropharyngeal exudate or posterior oropharyngeal erythema.  Eyes:     General: No scleral icterus.       Right eye: No discharge.        Left eye: No discharge.     Conjunctiva/sclera: Conjunctivae normal.  Neck:     Thyroid : No thyromegaly.  Cardiovascular:     Rate and Rhythm: Normal rate and regular rhythm.  Pulmonary:     Effort: No respiratory distress.     Breath sounds: Normal breath sounds. No wheezing.  Abdominal:     General: Bowel sounds are normal.     Palpations: Abdomen is soft.     Tenderness: There is no abdominal tenderness.  Musculoskeletal:        General: No swelling or tenderness.     Cervical back: Neck supple. No tenderness.  Lymphadenopathy:     Cervical: No cervical adenopathy.  Skin:    Findings: No erythema or rash.  Neurological:     Mental Status:  He is alert and oriented to person, place, and time.  Psychiatric:        Mood and Affect: Mood normal.        Behavior: Behavior normal.         Outpatient Encounter Medications as of 03/18/2024  Medication Sig   FIBER PO Take by mouth.   fluticasone  (FLONASE ) 50 MCG/ACT nasal spray SPRAY 2 SPRAYS  INTO EACH NOSTRIL EVERY DAY   Multiple Vitamin (MULTIVITAMIN ADULT PO) Take by mouth.   RABEprazole  (ACIPHEX ) 20 MG tablet Take 1 tablet (20 mg total) by mouth daily.   [DISCONTINUED] RABEprazole  (ACIPHEX ) 20 MG tablet Take 1 tablet (20 mg total) by mouth daily.   No facility-administered encounter medications on file as of 03/18/2024.     Lab Results  Component Value Date   WBC 4.2 12/20/2022   HGB 15.0 12/20/2022   HCT 45.6 12/20/2022   PLT 176.0 12/20/2022   GLUCOSE 86 09/11/2023   CHOL 167 09/11/2023   TRIG 65.0 09/11/2023   HDL 39.00 (L) 09/11/2023   LDLCALC 115 (H) 09/11/2023   ALT 34 09/11/2023   AST 27 09/11/2023   NA 139 09/11/2023   K 4.1 09/11/2023   CL 106 09/11/2023   CREATININE 1.16 09/11/2023   BUN 21 09/11/2023   CO2 28 09/11/2023   TSH 1.86 12/20/2022   PSA 0.46 12/20/2022   INR 0.9 03/15/2012    CT RENAL STONE STUDY Result Date: 02/05/2024 CLINICAL DATA:  Left lower quadrant and left flank pain. EXAM: CT ABDOMEN AND PELVIS WITHOUT CONTRAST TECHNIQUE: Multidetector CT imaging of the abdomen and pelvis was performed following the standard protocol without IV contrast. RADIATION DOSE REDUCTION: This exam was performed according to the departmental dose-optimization program which includes automated exposure control, adjustment of the mA and/or kV according to patient size and/or use of iterative reconstruction technique. COMPARISON:  03/15/2012 FINDINGS: Lower chest: No acute findings. Hepatobiliary: No mass visualized on this unenhanced exam. Prior cholecystectomy. No evidence of biliary obstruction. Pancreas: No mass or inflammatory process visualized on this  unenhanced exam. Spleen:  Within normal limits in size. Adrenals/Urinary tract: No evidence of urolithiasis or hydronephrosis. Unremarkable unopacified urinary bladder. Stomach/Bowel: No evidence of obstruction, inflammatory process, or abnormal fluid collections. Vascular/Lymphatic: No pathologically enlarged lymph nodes identified. No evidence of abdominal aortic aneurysm. Reproductive:  No mass or other significant abnormality. Other:  Tiny fat-containing umbilical hernia noted. Musculoskeletal:  No suspicious bone lesions identified. IMPRESSION: No evidence of urolithiasis, hydronephrosis, or other acute findings. Tiny fat-containing umbilical hernia. Electronically Signed   By: Norleen DELENA Kil M.D.   On: 02/05/2024 13:17       Assessment & Plan:  Health care maintenance Assessment & Plan: Physical today 03/18/24.   Colonoscopy 10/2019 - 6mm polyp transverse colon and internal hemorrhoids.  Recommended f/u in 5 years. Check psa with next labs.     Hypercholesterolemia Assessment & Plan: Low cholesterol diet and exercise. Check lipid panel with next fasting labs.   Orders: -     Basic metabolic panel with GFR; Future -     Hepatic function panel; Future -     Lipid panel; Future -     CBC with Differential/Platelet; Future -     TSH; Future  Prostate cancer screening -     PSA; Future  Umbilical hernia without obstruction and without gangrene Assessment & Plan: Saw Dr Vivien. Elected to hold on surgery. Follow.    History of colonic polyps Assessment & Plan: Colonoscopy 2021 - One 6 mm polyp in the transverse colon.  Internal hemorrhoids. The examination was otherwise normal   Gastroesophageal reflux disease, unspecified whether esophagitis present Assessment & Plan: Controlled with aciphex .    Left-sided low back pain without sciatica, unspecified chronicity Assessment & Plan: Low back pain. Worse after sitting for a while. Better once up and moving. Discussed PT. Wants to  hold notify me if  increased pain or change in symptoms.    Routine general medical examination at a health care facility  Other orders -     RABEprazole  Sodium; Take 1 tablet (20 mg total) by mouth daily.  Dispense: 90 tablet; Refill: 1     Allena Hamilton, MD

## 2024-03-18 NOTE — Assessment & Plan Note (Signed)
 Saw Dr Cintrol. Elected to hold on surgery. Follow.

## 2024-03-18 NOTE — Assessment & Plan Note (Signed)
Low cholesterol diet and exercise.  Check lipid panel with next fasting labs.    

## 2024-04-08 ENCOUNTER — Ambulatory Visit: Payer: Self-pay

## 2024-04-08 NOTE — Telephone Encounter (Signed)
 FYI Only or Action Required?: Action required by provider: Requesting Tamiflu .  Patient was last seen in primary care on 03/18/2024 by Glendia Shad, MD.  Called Nurse Triage reporting Cough.  Symptoms began today.  Interventions attempted: Nothing.  Symptoms are: stable.  Triage Disposition: Home Care  Patient/caregiver understands and will follow disposition?: Yes Reason for Disposition  Cough  Answer Assessment - Initial Assessment Questions Patient's son went to UC last night and test positive for Flu. Coworker was also sick yesterday. Patient requesting medication be sent to [facility] on file. Please advise.   1. ONSET: When did the cough begin?      Mid day today  2. SEVERITY: How bad is the cough today?      Severe cough when coughing, but not coughing non stop  3. SPUTUM: Describe the color of your sputum (e.g., none, dry cough; clear, white, yellow, green)     Denies  4. DIFFICULTY BREATHING: Are you having difficulty breathing? If Yes, ask: How bad is it? (e.g., mild, moderate, severe)      Denies  5. FEVER: Do you have a fever? If Yes, ask: What is your temperature, how was it measured, and when did it start?     Denies  6. CARDIAC HISTORY: Do you have any history of heart disease? (e.g., heart attack, congestive heart failure)      Denies  7. LUNG HISTORY: Do you have any history of lung disease?  (e.g., pulmonary embolus, asthma, emphysema)     Denies  8. OTHER SYMPTOMS: Do you have any other symptoms? (e.g., runny nose, wheezing, chest pain)       Headache, body aches, tired  Protocols used: Cough - Acute Productive-A-AH    Copied from CRM #[id number]. Topic: Clinical - Medication Question >> Apr 08, 2024  4:29 PM Viola F wrote: Reason for CRM: Patient has cough, headache and body aches - would like Tamiflu sent to his pharmacy on file. [phone] (M)

## 2024-04-09 ENCOUNTER — Telehealth: Admitting: Nurse Practitioner

## 2024-04-09 ENCOUNTER — Encounter: Payer: Self-pay | Admitting: Nurse Practitioner

## 2024-04-09 VITALS — Temp 97.3°F | Ht 71.5 in

## 2024-04-09 DIAGNOSIS — Z20828 Contact with and (suspected) exposure to other viral communicable diseases: Secondary | ICD-10-CM

## 2024-04-09 DIAGNOSIS — B349 Viral infection, unspecified: Secondary | ICD-10-CM | POA: Insufficient documentation

## 2024-04-09 MED ORDER — PSEUDOEPH-BROMPHEN-DM 30-2-10 MG/5ML PO SYRP: 5.0000 mL | ORAL_SOLUTION | Freq: Four times a day (QID) | ORAL | 0 refills | Status: AC | PRN

## 2024-04-09 MED ORDER — OSELTAMIVIR PHOSPHATE 75 MG PO CAPS: 75.0000 mg | ORAL_CAPSULE | Freq: Two times a day (BID) | ORAL | 0 refills | Status: AC

## 2024-04-09 NOTE — Progress Notes (Signed)
 MyChart Video Visit    Virtual Visit via Video Note   This visit type was conducted because this format is felt to be most appropriate for this patient at this time. Physical exam was limited by quality of the video and audio technology used for the visit. CMA was able to get the patient set up on a video visit.  Patient location: Home. Patient and provider in visit Provider location: Office  I discussed the limitations of evaluation and management by telemedicine and the availability of in person appointments. The patient expressed understanding and agreed to proceed.  Visit Date: 04/09/2024  Today's healthcare provider: Leron Glance, NP     Subjective:    Patient ID: Victor Craig, male    DOB: 1975/10/22, 48 y.o.   MRN: [id number]  Chief Complaint  Patient presents with   Fever   Cough    With headaches and body aches     HPI  Discussed the use of AI scribe software for clinical note transcription with the patient, who gave verbal consent to proceed.  History of Present Illness   Victor Craig is a 48 year old male who presents with flu-like symptoms after exposure to his son who tested positive for the flu.  His son tested positive for the flu several days ago. Despite efforts to avoid exposure, he began experiencing symptoms yesterday morning, including a cough and general malaise. As the day progressed, his symptoms worsened, with the cough intensifying and the onset of a severe headache described as 'splitting' and accompanied by a sensation of hearing his heartbeat in his ears. He also experienced chills, body aches, and a fever of 101.29F.  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 Upon returning home, he took Tylenol  and Delsym cough syrup, which provided some relief. This morning, he feels somewhat better, with a reduced cough and no fever, although he took four Advil two hours before checking his  temperature, which was 97.29F.  No congestion, drainage, sore throat, shortness of breath, nausea, vomiting, or diarrhea. His son, who was prescribed Tamiflu , is reportedly improving but still has a persistent cough. He has not taken Tamiflu  before.  Current medications include Tylenol , Advil, and Delsym cough syrup. He is allergic to sulfa drugs.      Past Medical History:  Diagnosis Date   Allergy    GERD (gastroesophageal reflux disease)    Hiatal hernia    Hx of migraines    IBS (irritable bowel syndrome)    Pancreatitis    Serrated polyp of colon 06/2014   Triceps tendon rupture    right    Past Surgical History:  Procedure Laterality Date   COLONOSCOPY  06/21/2014   COLONOSCOPY  11/09/2019   LAPAROSCOPIC CHOLECYSTECTOMY     POLYPECTOMY     SPHINCTEROTOMY     TONSILLECTOMY     TRICEPS TENDON REPAIR Right 04/27/2015   Procedure: RIGHT DISTAL TRICEPS TENDON REPAIR;  Surgeon: Toribio JULIANNA Chancy, MD;  Location: [facility];  Service: Orthopedics;  Laterality: Right;   tricept  reattached Left    ULNAR NERVE TRANSPOSITION Right 04/27/2015   Procedure: ULNAR NERVE DECOMPRESSION;  Surgeon: Toribio JULIANNA Chancy, MD;  Location: [facility];  Service: Orthopedics;  Laterality: Right;    Family History  Problem Relation Age of Onset   Colon polyps Father    Prostate cancer Paternal Grandfather    Colon  cancer Paternal Grandfather    Heart disease Maternal Grandfather    Hypertension Maternal Grandfather    Heart disease Mother    Hypertension Mother    Esophageal cancer Neg Hx    Rectal cancer Neg Hx    Stomach cancer Neg Hx     Social History   Socioeconomic History   Marital status: Married    Spouse name: Not on file   Number of children: 2   Years of education: Not on file   Highest education level: Not on file  Occupational History   Occupation: Magazine Features Editor: [employer]  Tobacco Use   Smoking status: Never    Smokeless tobacco: Never  Vaping Use   Vaping status: Never Used  Substance and Sexual Activity   Alcohol use: No   Drug use: No   Sexual activity: Not on file  Other Topics Concern   Not on file  Social History Narrative   Not on file   Social Drivers of Health   Financial Resource Strain: Low Risk  (03/25/2024)   Received from [facility]   Overall Financial Resource Strain (CARDIA)    Difficulty of Paying Living Expenses: Not hard at all  Food Insecurity: No Food Insecurity (03/25/2024)   Received from [facility]   Hunger Vital Sign    Within the past 12 months, you worried that your food would run out before you got the money to buy more.: Never true    Within the past 12 months, the food you bought just didn't last and you didn't have money to get more.: Never true  Transportation Needs: No Transportation Needs (03/25/2024)   Received from [facility] Transportation    In the past 12 months, has lack of transportation kept you from medical appointments or from getting medications?: No    Lack of Transportation (Non-Medical): No  Physical Activity: Not on file  Stress: Not on file  Social Connections: Not on file  Intimate Partner Violence: Not on file    Outpatient Medications Prior to Visit  Medication Sig Dispense Refill   FIBER PO Take by mouth.     fluticasone  (FLONASE ) 50 MCG/ACT nasal spray SPRAY 2 SPRAYS INTO EACH NOSTRIL EVERY DAY 48 mL 3   Multiple Vitamin (MULTIVITAMIN ADULT PO) Take by mouth.     RABEprazole  (ACIPHEX ) 20 MG tablet Take 1 tablet (20 mg total) by mouth daily. 90 tablet 1   No facility-administered medications prior to visit.    Allergies  Allergen Reactions   Sulfasalazine Hives    REACTION: hives REACTION: hives    Sulfonamide Derivatives     REACTION: hives    ROS See HPI    Objective:    Physical Exam  Temp (!) 97.3 F (36.3 C) Comment: Per pt  Ht 5' 11.5  (1.816 m)   BMI 33.63 kg/m  Wt Readings from Last 3 Encounters:  03/18/24 244 lb 8 oz (110.9 kg)  01/20/24 240 lb (108.9 kg)  01/15/24 240 lb (108.9 kg)   GENERAL: alert, oriented, appears well and in no acute distress   HEENT: atraumatic, conjunttiva clear, no obvious abnormalities on inspection of external nose and ears   NECK: normal movements of the head and neck   LUNGS: on inspection no signs of respiratory distress, breathing rate appears normal, no obvious gross SOB, gasping or wheezing   CV: no obvious cyanosis   MS: moves  all visible extremities without noticeable abnormality   PSYCH/NEURO: pleasant and cooperative, no obvious depression or anxiety, speech and thought processing grossly intact    Assessment & Plan:   Problem List Items Addressed This Visit     Viral illness - Primary   Suspected influenza due to exposure to his son with a confirmed case. Symptoms include cough, headache, chills, body aches, and fever. Fever is managed with Tylenol  and Advil, and symptoms have improved. Tamiflu  was discussed, advised it will shorten the duration and severity of symptoms. Counseled on common side effects. Prescribe Tamiflu  75 mg twice daily x 5 days. Continue Tylenol  and Advil for fever management. Prescribed Bromfed DM at 5-10 mL, with 10 mL recommended at bedtime due to drowsiness. Advised adequate hydration. Return precautions given to patient.       Relevant Medications   oseltamivir  (TAMIFLU ) 75 MG capsule   brompheniramine-pseudoephedrine-DM 30-2-10 MG/5ML syrup   Other Visit Diagnoses       Exposure to the flu       Relevant Medications   oseltamivir  (TAMIFLU ) 75 MG capsule       I am having Victor B. Butler Crock start on oseltamivir  and brompheniramine-pseudoephedrine-DM. I am also having him maintain his Multiple Vitamin (MULTIVITAMIN ADULT PO), FIBER PO, fluticasone , and RABEprazole .  Meds ordered this encounter  Medications   oseltamivir  (TAMIFLU )  75 MG capsule    Sig: Take 1 capsule (75 mg total) by mouth 2 (two) times daily.    Dispense:  10 capsule    Refill:  0    Supervising Provider:   TULLO, TERESA L [[id number]]   brompheniramine-pseudoephedrine-DM 30-2-10 MG/5ML syrup    Sig: Take 5-10 mLs by mouth every 6 (six) hours as needed.    Dispense:  120 mL    Refill:  0    Supervising Provider:   MARYLYNN VERNEITA CROME [[id number]]   I discussed the assessment and treatment plan with the patient. The patient was provided an opportunity to ask questions and all were answered. The patient agreed with the plan and demonstrated an understanding of the instructions.   The patient was advised to call back or seek an in-person evaluation if the symptoms worsen or if the condition fails to improve as anticipated.   Leron Glance, NP [facility] [phone] (phone) [phone] (fax)  [facility]

## 2024-04-09 NOTE — Telephone Encounter (Signed)
 Pt scheduled this morning with Kacy for a Virtual visit.

## 2024-04-09 NOTE — Assessment & Plan Note (Addendum)
 Suspected influenza due to exposure to his son with a confirmed case. Symptoms include cough, headache, chills, body aches, and fever. Fever is managed with Tylenol  and Advil, and symptoms have improved. Tamiflu  was discussed, advised it will shorten the duration and severity of symptoms. Counseled on common side effects. Prescribe Tamiflu  75 mg twice daily x 5 days. Continue Tylenol  and Advil for fever management. Prescribed Bromfed DM at 5-10 mL, with 10 mL recommended at bedtime due to drowsiness. Advised adequate hydration. Return precautions given to patient.

## 2024-04-13 ENCOUNTER — Other Ambulatory Visit (INDEPENDENT_AMBULATORY_CARE_PROVIDER_SITE_OTHER)

## 2024-04-13 DIAGNOSIS — E7800 Pure hypercholesterolemia, unspecified: Secondary | ICD-10-CM | POA: Diagnosis not present

## 2024-04-13 DIAGNOSIS — Z125 Encounter for screening for malignant neoplasm of prostate: Secondary | ICD-10-CM

## 2024-04-13 LAB — CBC WITH DIFFERENTIAL/PLATELET
Basophils Absolute: 0 K/uL (ref 0.0–0.1)
Basophils Relative: 0.7 % (ref 0.0–3.0)
Eosinophils Absolute: 0.2 K/uL (ref 0.0–0.7)
Eosinophils Relative: 6.7 % — ABNORMAL HIGH (ref 0.0–5.0)
HCT: 44.5 % (ref 39.0–52.0)
Hemoglobin: 15.1 g/dL (ref 13.0–17.0)
Lymphocytes Relative: 33.6 % (ref 12.0–46.0)
Lymphs Abs: 0.9 K/uL (ref 0.7–4.0)
MCHC: 34 g/dL (ref 30.0–36.0)
MCV: 89.1 fl (ref 78.0–100.0)
Monocytes Absolute: 0.4 K/uL (ref 0.1–1.0)
Monocytes Relative: 13.7 % — ABNORMAL HIGH (ref 3.0–12.0)
Neutro Abs: 1.2 K/uL — ABNORMAL LOW (ref 1.4–7.7)
Neutrophils Relative %: 45.3 % (ref 43.0–77.0)
Platelets: 150 K/uL (ref 150.0–400.0)
RBC: 5 Mil/uL (ref 4.22–5.81)
RDW: 12.4 % (ref 11.5–15.5)
WBC: 2.6 K/uL — ABNORMAL LOW (ref 4.0–10.5)

## 2024-04-13 LAB — LIPID PANEL
Cholesterol: 149 mg/dL (ref 0–200)
HDL: 32.7 mg/dL — ABNORMAL LOW (ref >39.00)
LDL Cholesterol: 102 mg/dL — ABNORMAL HIGH (ref 0–99)
NonHDL: 115.8
Total CHOL/HDL Ratio: 5
Triglycerides: 68 mg/dL (ref 0.0–149.0)
VLDL: 13.6 mg/dL (ref 0.0–40.0)

## 2024-04-13 LAB — BASIC METABOLIC PANEL WITH GFR
BUN: 18 mg/dL (ref 6–23)
CO2: 28 meq/L (ref 19–32)
Calcium: 9 mg/dL (ref 8.4–10.5)
Chloride: 104 meq/L (ref 96–112)
Creatinine, Ser: 1.01 mg/dL (ref 0.40–1.50)
GFR: 86.56 mL/min (ref >60.00)
Glucose, Bld: 89 mg/dL (ref 70–99)
Potassium: 4.4 meq/L (ref 3.5–5.1)
Sodium: 138 meq/L (ref 135–145)

## 2024-04-13 LAB — HEPATIC FUNCTION PANEL
ALT: 48 U/L (ref 0–53)
AST: 28 U/L (ref 0–37)
Albumin: 4.2 g/dL (ref 3.5–5.2)
Alkaline Phosphatase: 29 U/L — ABNORMAL LOW (ref 39–117)
Bilirubin, Direct: 0.1 mg/dL (ref 0.0–0.3)
Total Bilirubin: 0.7 mg/dL (ref 0.2–1.2)
Total Protein: 6.4 g/dL (ref 6.0–8.3)

## 2024-04-13 LAB — TSH: TSH: 2.03 u[IU]/mL (ref 0.35–5.50)

## 2024-04-13 LAB — PSA: PSA: 0.44 ng/mL (ref 0.10–4.00)

## 2024-04-14 ENCOUNTER — Other Ambulatory Visit: Payer: Self-pay | Admitting: Internal Medicine

## 2024-04-14 ENCOUNTER — Encounter: Payer: Self-pay | Admitting: Internal Medicine

## 2024-04-14 ENCOUNTER — Ambulatory Visit: Payer: Self-pay | Admitting: Internal Medicine

## 2024-04-14 DIAGNOSIS — D72819 Decreased white blood cell count, unspecified: Secondary | ICD-10-CM

## 2024-04-14 NOTE — Progress Notes (Signed)
Order placed for f/u cbc.   

## 2024-05-01 ENCOUNTER — Encounter: Payer: Self-pay | Admitting: Internal Medicine

## 2024-05-04 ENCOUNTER — Other Ambulatory Visit (INDEPENDENT_AMBULATORY_CARE_PROVIDER_SITE_OTHER)

## 2024-05-04 DIAGNOSIS — D72819 Decreased white blood cell count, unspecified: Secondary | ICD-10-CM

## 2024-05-04 LAB — CBC WITH DIFFERENTIAL/PLATELET
Basophils Absolute: 0 K/uL (ref 0.0–0.1)
Basophils Relative: 0.8 % (ref 0.0–3.0)
Eosinophils Absolute: 0.2 K/uL (ref 0.0–0.7)
Eosinophils Relative: 4.2 % (ref 0.0–5.0)
HCT: 42.9 % (ref 39.0–52.0)
Hemoglobin: 14.7 g/dL (ref 13.0–17.0)
Lymphocytes Relative: 26.3 % (ref 12.0–46.0)
Lymphs Abs: 1 K/uL (ref 0.7–4.0)
MCHC: 34.2 g/dL (ref 30.0–36.0)
MCV: 88.3 fl (ref 78.0–100.0)
Monocytes Absolute: 0.4 K/uL (ref 0.1–1.0)
Monocytes Relative: 9.8 % (ref 3.0–12.0)
Neutro Abs: 2.3 K/uL (ref 1.4–7.7)
Neutrophils Relative %: 58.9 % (ref 43.0–77.0)
Platelets: 162 K/uL (ref 150.0–400.0)
RBC: 4.85 Mil/uL (ref 4.22–5.81)
RDW: 12.6 % (ref 11.5–15.5)
WBC: 3.9 K/uL — ABNORMAL LOW (ref 4.0–10.5)

## 2024-05-05 ENCOUNTER — Ambulatory Visit: Payer: Self-pay | Admitting: Internal Medicine

## 2024-05-05 ENCOUNTER — Other Ambulatory Visit: Payer: Self-pay | Admitting: Internal Medicine

## 2024-05-05 DIAGNOSIS — D72819 Decreased white blood cell count, unspecified: Secondary | ICD-10-CM

## 2024-05-05 NOTE — Progress Notes (Signed)
Order placed for f/u cbc.   

## 2024-05-10 ENCOUNTER — Ambulatory Visit: Admit: 2024-05-10 | Admitting: General Surgery

## 2024-05-10 SURGERY — REPAIR, HERNIA, UMBILICAL, ROBOT-ASSISTED
Anesthesia: General | Site: Abdomen

## 2024-08-05 ENCOUNTER — Other Ambulatory Visit

## 2025-03-21 ENCOUNTER — Encounter: Admitting: Internal Medicine
